# Patient Record
Sex: Female | Born: 1980 | Race: White | Hispanic: No | Smoking: Current every day smoker
Health system: Southern US, Community
[De-identification: ages and names within clinical notes are randomized; demographics above are authoritative.]

## PROBLEM LIST (undated history)

## (undated) DIAGNOSIS — S161XXA Strain of muscle, fascia and tendon at neck level, initial encounter: Secondary | ICD-10-CM

## (undated) DIAGNOSIS — F329 Major depressive disorder, single episode, unspecified: Secondary | ICD-10-CM

## (undated) DIAGNOSIS — I10 Essential (primary) hypertension: Secondary | ICD-10-CM

## (undated) DIAGNOSIS — F419 Anxiety disorder, unspecified: Secondary | ICD-10-CM

## (undated) DIAGNOSIS — F32A Depression, unspecified: Secondary | ICD-10-CM

## (undated) DIAGNOSIS — O139 Gestational [pregnancy-induced] hypertension without significant proteinuria, unspecified trimester: Secondary | ICD-10-CM

## (undated) HISTORY — DX: Depression, unspecified: F32.A

## (undated) HISTORY — DX: Anxiety disorder, unspecified: F41.9

## (undated) HISTORY — DX: Gestational (pregnancy-induced) hypertension without significant proteinuria, unspecified trimester: O13.9

## (undated) HISTORY — DX: Major depressive disorder, single episode, unspecified: F32.9

## (undated) HISTORY — DX: Strain of muscle, fascia and tendon at neck level, initial encounter: S16.1XXA

## (undated) HISTORY — DX: Essential (primary) hypertension: I10

---

## 2009-05-24 ENCOUNTER — Emergency Department (HOSPITAL_COMMUNITY): Admission: EM | Admit: 2009-05-24 | Discharge: 2009-05-24 | Payer: Self-pay | Admitting: Emergency Medicine

## 2010-12-16 ENCOUNTER — Emergency Department (HOSPITAL_COMMUNITY)
Admission: EM | Admit: 2010-12-16 | Discharge: 2010-12-16 | Disposition: A | Discharge status: Home / Self Care | Payer: PRIVATE HEALTH INSURANCE | Attending: Emergency Medicine | Admitting: Emergency Medicine

## 2010-12-16 DIAGNOSIS — R300 Dysuria: Secondary | ICD-10-CM | POA: Insufficient documentation

## 2010-12-16 DIAGNOSIS — Z87442 Personal history of urinary calculi: Secondary | ICD-10-CM | POA: Insufficient documentation

## 2010-12-16 DIAGNOSIS — R10814 Left lower quadrant abdominal tenderness: Secondary | ICD-10-CM | POA: Insufficient documentation

## 2010-12-16 LAB — URINALYSIS, ROUTINE W REFLEX MICROSCOPIC
Hgb urine dipstick: NEGATIVE
Nitrite: NEGATIVE
Specific Gravity, Urine: 1.015 (ref 1.005–1.030)
Urobilinogen, UA: 1 mg/dL (ref 0.0–1.0)
pH: 7.5 (ref 5.0–8.0)

## 2010-12-16 LAB — POCT PREGNANCY, URINE: Preg Test, Ur: NEGATIVE

## 2011-01-16 LAB — PREGNANCY, URINE: Preg Test, Ur: NEGATIVE

## 2011-01-16 LAB — URINALYSIS, ROUTINE W REFLEX MICROSCOPIC
Ketones, ur: NEGATIVE mg/dL
Leukocytes, UA: NEGATIVE
Nitrite: NEGATIVE
Protein, ur: NEGATIVE mg/dL

## 2011-01-16 LAB — URINE CULTURE: Culture: NO GROWTH

## 2011-01-16 LAB — URINE MICROSCOPIC-ADD ON

## 2014-01-30 ENCOUNTER — Other Ambulatory Visit: Payer: Self-pay

## 2014-01-30 ENCOUNTER — Encounter (HOSPITAL_COMMUNITY): Payer: Self-pay | Admitting: Emergency Medicine

## 2014-01-30 ENCOUNTER — Emergency Department (HOSPITAL_COMMUNITY)
Admission: EM | Admit: 2014-01-30 | Discharge: 2014-01-30 | Disposition: A | Discharge status: Home / Self Care | Payer: BC Managed Care – PPO | Attending: Emergency Medicine | Admitting: Emergency Medicine

## 2014-01-30 ENCOUNTER — Emergency Department (HOSPITAL_COMMUNITY): Payer: BC Managed Care – PPO

## 2014-01-30 DIAGNOSIS — M940 Chondrocostal junction syndrome [Tietze]: Secondary | ICD-10-CM | POA: Insufficient documentation

## 2014-01-30 DIAGNOSIS — R0602 Shortness of breath: Secondary | ICD-10-CM | POA: Diagnosis present

## 2014-01-30 LAB — TROPONIN I: Troponin I: <0.3 ng/mL (ref <0.30)

## 2014-01-30 LAB — POC URINE PREG, ED: Preg Test, Ur: NEGATIVE

## 2014-01-30 LAB — D-DIMER, QUANTITATIVE: D-Dimer, Quant: <0.27 ug/mL-FEU (ref 0.00–0.48)

## 2014-01-30 MED ORDER — IBUPROFEN 800 MG PO TABS
800.0000 mg | ORAL_TABLET | Freq: Once | ORAL | Status: AC
  Administered 2014-01-30: 800 mg via ORAL
  Filled 2014-01-30: qty 1

## 2014-01-30 MED ORDER — ACETAMINOPHEN 325 MG PO TABS
650.0000 mg | ORAL_TABLET | Freq: Once | ORAL | Status: AC
  Administered 2014-01-30: 650 mg via ORAL

## 2014-01-30 MED ORDER — ACETAMINOPHEN 325 MG PO TABS
ORAL_TABLET | ORAL | Status: AC
  Filled 2014-01-30: qty 2

## 2014-01-30 NOTE — ED Notes (Signed)
Patient given discharge instruction, verbalized understand. Patient ambulatory out of the department.  

## 2014-01-30 NOTE — ED Provider Notes (Signed)
CSN: [id number]     Arrival date & time 01/30/14  1500 History   First MD Initiated Contact with Patient 01/30/14 1717     Chief Complaint  Patient presents with  . Shortness of Breath  . Chest Pain     (Consider location/radiation/quality/duration/timing/severity/associated sxs/prior Treatment) HPI Comments: And patient is a 33 year old female presents to the emergency department with complaint of chest pain and shortness of breath for approximately 2 weeks. The patient states that she has a pain in the Center of her anterior chest that is sharp and stabbing at times. The patient states that this pain can last for several hours and some time up to a half a day. It is aggravated when she lifts, particularly when she lifts her 32 pounds baby boy. She's not had coughing, wheezing, hemoptysis, or high fever. She's not had any direct injury or trauma to the chest area. She has no history of cardiopulmonary disease. She has tried Tylenol for this discomfort, and it has been unsuccessful in helping her discomfort.  Patient is a 33 y.o. female presenting with shortness of breath and chest pain. The history is provided by the patient.  Shortness of Breath Associated symptoms: chest pain   Associated symptoms: no abdominal pain, no cough, no neck pain and no wheezing   Chest Pain Associated symptoms: shortness of breath   Associated symptoms: no abdominal pain, no back pain, no cough, no dizziness and no palpitations     History reviewed. No pertinent past medical history. History reviewed. No pertinent past surgical history. History reviewed. No pertinent family history. History  Substance Use Topics  . Smoking status: Never Smoker   . Smokeless tobacco: Not on file  . Alcohol Use: No   OB History   Grav Para Term Preterm Abortions TAB SAB Ect Mult Living                 Review of Systems  Constitutional: Negative for activity change.       All ROS Neg except as noted in HPI  HENT:  Negative for nosebleeds.   Eyes: Negative for photophobia and discharge.  Respiratory: Positive for shortness of breath. Negative for cough and wheezing.   Cardiovascular: Positive for chest pain. Negative for palpitations.  Gastrointestinal: Negative for abdominal pain and blood in stool.  Genitourinary: Negative for dysuria, frequency and hematuria.  Musculoskeletal: Negative for arthralgias, back pain and neck pain.  Skin: Negative.   Neurological: Negative for dizziness, seizures and speech difficulty.  Psychiatric/Behavioral: Negative for hallucinations and confusion.      Allergies  Peanut-containing drug products  Home Medications   Prior to Admission medications   Medication Sig Start Date End Date Taking? Authorizing Provider  acetaminophen (TYLENOL) 500 MG tablet Take 500 mg by mouth every 6 (six) hours as needed for mild pain or moderate pain.   Yes Historical Provider, MD   BP 158/103  Pulse 116  Temp(Src) 98.1 F (36.7 C) (Oral)  Resp 20  Ht 5\' 5"  (1.651 m)  Wt 155 lb (70.308 kg)  BMI 25.79 kg/m2  SpO2 100%  LMP 01/19/2014 Physical Exam  Nursing note and vitals reviewed. Constitutional: She is oriented to person, place, and time. She appears well-developed and well-nourished.  Non-toxic appearance.  HENT:  Head: Normocephalic.  Right Ear: Tympanic membrane and external ear normal.  Left Ear: Tympanic membrane and external ear normal.  Eyes: EOM and lids are normal. Pupils are equal, round, and reactive to light.  Neck: Normal  range of motion. Neck supple. Carotid bruit is not present.  Cardiovascular: Normal rate, regular rhythm, normal heart sounds, intact distal pulses and normal pulses.  Exam reveals no gallop and no friction rub.   No murmur heard. No JVD.  Pulmonary/Chest: Breath sounds normal. No respiratory distress. She exhibits tenderness.  Lungs are clear. Patient speaks in complete sentences without problem. There is symmetrical rise and fall of  the chest.  I can't reproduce the pain by palpation of the mid sternum. And also a the left chest adjacent to the sternum.  Abdominal: Soft. Bowel sounds are normal. There is no tenderness. There is no guarding.  Musculoskeletal: Normal range of motion. She exhibits no edema.  Lymphadenopathy:       Head (right side): No submandibular adenopathy present.       Head (left side): No submandibular adenopathy present.    She has no cervical adenopathy.  Neurological: She is alert and oriented to person, place, and time. She has normal strength. No cranial nerve deficit or sensory deficit.  Skin: Skin is warm and dry.  Psychiatric: She has a normal mood and affect. Her speech is normal.    ED Course  Procedures (including critical care time) Labs Review Labs Reviewed  TROPONIN I  POC URINE PREG, ED    Imaging Review No results found.   EKG Interpretation None      MDM The chest xray is negative for acute problem. D dimer and troponin negative for acute event. The EKG reveals NSR, no acute event. Pt treated with ibuprofen and tylenol. Pt to return if any changes or problem.   Final diagnoses:  None    *I have reviewed nursing notes, vital signs, and all appropriate lab and imaging results for this patient.Kathie Dike**    Roylee Chaffin M Richrd Kuzniar, PA-C 02/01/14 [phone]

## 2014-01-30 NOTE — Discharge Instructions (Signed)
Your electrocardiogram shows a normal sinus rhythm, without acute event. Your creatinine sign is negative for acute problem. Your test for blood clot is negative, and your chest x-ray is also negative. Your examination is consistent with costochondritis, or inflammation in the cartilage beside your sternum. Please use ibuprofen 800 mg every 6 hours as Costochondritis Costochondritis, sometimes called Tietze syndrome, is a swelling and irritation (inflammation) of the tissue (cartilage) that connects your ribs with your breastbone (sternum). It causes pain in the chest and rib area. Costochondritis usually goes away on its own over time. It can take up to 6 weeks or longer to get better, especially if you are unable to limit your activities. CAUSES  Some cases of costochondritis have no known cause. Possible causes include:  Injury (trauma).  Exercise or activity such as lifting.  Severe coughing. SIGNS AND SYMPTOMS  Pain and tenderness in the chest and rib area.  Pain that gets worse when coughing or taking deep breaths.  Pain that gets worse with specific movements. DIAGNOSIS  Your health care provider will do a physical exam and ask about your symptoms. Chest X-rays or other tests may be done to rule out other problems. TREATMENT  Costochondritis usually goes away on its own over time. Your health care provider may prescribe medicine to help relieve pain. HOME CARE INSTRUCTIONS   Avoid exhausting physical activity. Try not to strain your ribs during normal activity. This would include any activities using chest, abdominal, and side muscles, especially if heavy weights are used.  Apply ice to the affected area for the first 2 days after the pain begins.  Put ice in a plastic bag.  Place a towel between your skin and the bag.  Leave the ice on for 20 minutes, 2 3 times a day.  Only take over-the-counter or prescription medicines as directed by your health care provider. SEEK  MEDICAL CARE IF:  You have redness or swelling at the rib joints. These are signs of infection.  Your pain does not go away despite rest or medicine. SEEK IMMEDIATE MEDICAL CARE IF:   Your pain increases or you are very uncomfortable.  You have shortness of breath or difficulty breathing.  You cough up blood.  You have worse chest pains, sweating, or vomiting.  You have a fever or persistent symptoms for more than 2 3 days.  You have a fever and your symptoms suddenly get worse. MAKE SURE YOU:   Understand these instructions.  Will watch your condition.  Will get help right away if you are not doing well or get worse. Document Released: 07/06/2005 Document Revised: 07/17/2013 Document Reviewed: 04/30/2013 [facility] Patient Information 2014 [place], [place]. , may use Tylenol Extra Strength in between the 6 hour doses of ibuprofen.

## 2014-01-30 NOTE — ED Notes (Signed)
Sob times 2 weeks.  Chest pain started last night.

## 2014-02-01 NOTE — ED Provider Notes (Signed)
Medical screening examination/treatment/procedure(s) were performed by non-physician practitioner and as supervising physician I was immediately available for consultation/collaboration.   EKG Interpretation None        Iliani Vejar L Dacen Frayre, MD 02/01/14 1538 

## 2016-02-04 ENCOUNTER — Encounter (HOSPITAL_COMMUNITY): Payer: Self-pay | Admitting: Emergency Medicine

## 2016-02-04 ENCOUNTER — Emergency Department (HOSPITAL_COMMUNITY)
Admission: EM | Admit: 2016-02-04 | Discharge: 2016-02-04 | Disposition: A | Discharge status: Home / Self Care | Payer: Medicaid Other | Attending: Emergency Medicine | Admitting: Emergency Medicine

## 2016-02-04 DIAGNOSIS — H66002 Acute suppurative otitis media without spontaneous rupture of ear drum, left ear: Secondary | ICD-10-CM

## 2016-02-04 DIAGNOSIS — H9202 Otalgia, left ear: Secondary | ICD-10-CM | POA: Diagnosis present

## 2016-02-04 DIAGNOSIS — H66005 Acute suppurative otitis media without spontaneous rupture of ear drum, recurrent, left ear: Secondary | ICD-10-CM | POA: Insufficient documentation

## 2016-02-04 DIAGNOSIS — Z79899 Other long term (current) drug therapy: Secondary | ICD-10-CM | POA: Diagnosis not present

## 2016-02-04 DIAGNOSIS — Z791 Long term (current) use of non-steroidal anti-inflammatories (NSAID): Secondary | ICD-10-CM | POA: Insufficient documentation

## 2016-02-04 DIAGNOSIS — I10 Essential (primary) hypertension: Secondary | ICD-10-CM | POA: Diagnosis not present

## 2016-02-04 DIAGNOSIS — J029 Acute pharyngitis, unspecified: Secondary | ICD-10-CM | POA: Insufficient documentation

## 2016-02-04 LAB — BASIC METABOLIC PANEL
Anion gap: 10 (ref 5–15)
BUN: 8 mg/dL (ref 6–20)
CALCIUM: 9.4 mg/dL (ref 8.9–10.3)
CO2: 26 mmol/L (ref 22–32)
CREATININE: 0.78 mg/dL (ref 0.44–1.00)
Chloride: 104 mmol/L (ref 101–111)
GFR calc Af Amer: >60 mL/min (ref >60)
Glucose, Bld: 96 mg/dL (ref 65–99)
Potassium: 3.5 mmol/L (ref 3.5–5.1)
SODIUM: 140 mmol/L (ref 135–145)

## 2016-02-04 MED ORDER — AMOXICILLIN 250 MG PO CAPS
500.0000 mg | ORAL_CAPSULE | Freq: Once | ORAL | Status: AC
  Administered 2016-02-04: 500 mg via ORAL
  Filled 2016-02-04: qty 2

## 2016-02-04 MED ORDER — AMLODIPINE BESYLATE 5 MG PO TABS: 5.0000 mg | ORAL_TABLET | Freq: Every day | ORAL | Status: DC

## 2016-02-04 MED ORDER — AMOXICILLIN 500 MG PO CAPS: 500.0000 mg | ORAL_CAPSULE | Freq: Three times a day (TID) | ORAL | Status: AC

## 2016-02-04 NOTE — ED Notes (Signed)
Pt states she is having bilateral ear pain worse on the left.

## 2016-02-04 NOTE — Discharge Instructions (Signed)
DASH Eating Plan °DASH stands for "Dietary Approaches to Stop Hypertension." The DASH eating plan is a healthy eating plan that has been shown to reduce high blood pressure (hypertension). Additional health benefits may include reducing the risk of type 2 diabetes mellitus, heart disease, and stroke. The DASH eating plan may also help with weight loss. °WHAT DO I NEED TO KNOW ABOUT THE DASH EATING PLAN? °For the DASH eating plan, you will follow these general guidelines: °· Choose foods with a percent daily value for sodium of less than 5% (as listed on the food label). °· Use salt-free seasonings or herbs instead of table salt or sea salt. °· Check with your health care provider or pharmacist before using salt substitutes. °· Eat lower-sodium products, often labeled as "lower sodium" or "no salt added." °· Eat fresh foods. °· Eat more vegetables, fruits, and low-fat dairy products. °· Choose whole grains. Look for the word "whole" as the first word in the ingredient list. °· Choose fish and skinless chicken or turkey more often than red meat. Limit fish, poultry, and meat to 6 oz (170 g) each day. °· Limit sweets, desserts, sugars, and sugary drinks. °· Choose heart-healthy fats. °· Limit cheese to 1 oz (28 g) per day. °· Eat more home-cooked food and less restaurant, buffet, and fast food. °· Limit fried foods. °· Cook foods using methods other than frying. °· Limit canned vegetables. If you do use them, rinse them well to decrease the sodium. °· When eating at a restaurant, ask that your food be prepared with less salt, or no salt if possible. °WHAT FOODS CAN I EAT? °Seek help from a dietitian for individual calorie needs. °Grains °Whole grain or whole wheat bread. Brown rice. Whole grain or whole wheat pasta. Quinoa, bulgur, and whole grain cereals. Low-sodium cereals. Corn or whole wheat flour tortillas. Whole grain cornbread. Whole grain crackers. Low-sodium crackers. °Vegetables °Fresh or frozen vegetables  (raw, steamed, roasted, or grilled). Low-sodium or reduced-sodium tomato and vegetable juices. Low-sodium or reduced-sodium tomato sauce and paste. Low-sodium or reduced-sodium canned vegetables.  °Fruits °All fresh, canned (in natural juice), or frozen fruits. °Meat and Other Protein Products °Ground beef (85% or leaner), grass-fed beef, or beef trimmed of fat. Skinless chicken or turkey. Ground chicken or turkey. Pork trimmed of fat. All fish and seafood. Eggs. Dried beans, peas, or lentils. Unsalted nuts and seeds. Unsalted canned beans. °Dairy °Low-fat dairy products, such as skim or 1% milk, 2% or reduced-fat cheeses, low-fat ricotta or cottage cheese, or plain low-fat yogurt. Low-sodium or reduced-sodium cheeses. °Fats and Oils °Tub margarines without trans fats. Light or reduced-fat mayonnaise and salad dressings (reduced sodium). Avocado. Safflower, olive, or canola oils. Natural peanut or almond butter. °Other °Unsalted popcorn and pretzels. °The items listed above may not be a complete list of recommended foods or beverages. Contact your dietitian for more options. °WHAT FOODS ARE NOT RECOMMENDED? °Grains °White bread. White pasta. White rice. Refined cornbread. Bagels and croissants. Crackers that contain trans fat. °Vegetables °Creamed or fried vegetables. Vegetables in a cheese sauce. Regular canned vegetables. Regular canned tomato sauce and paste. Regular tomato and vegetable juices. °Fruits °Dried fruits. Canned fruit in light or heavy syrup. Fruit juice. °Meat and Other Protein Products °Fatty cuts of meat. Ribs, chicken wings, bacon, sausage, bologna, salami, chitterlings, fatback, hot dogs, bratwurst, and packaged luncheon meats. Salted nuts and seeds. Canned beans with salt. °Dairy °Whole or 2% milk, cream, half-and-half, and cream cheese. Whole-fat or sweetened yogurt. Full-fat   cheeses or blue cheese. Nondairy creamers and whipped toppings. Processed cheese, cheese spreads, or cheese  curds. Condiments Onion and garlic salt, seasoned salt, table salt, and sea salt. Canned and packaged gravies. Worcestershire sauce. Tartar sauce. Barbecue sauce. Teriyaki sauce. Soy sauce, including reduced sodium. Steak sauce. Fish sauce. Oyster sauce. Cocktail sauce. Horseradish. Ketchup and mustard. Meat flavorings and tenderizers. Bouillon cubes. Hot sauce. Tabasco sauce. Marinades. Taco seasonings. Relishes. Fats and Oils Butter, stick margarine, lard, shortening, ghee, and bacon fat. Coconut, palm kernel, or palm oils. Regular salad dressings. Other Pickles and olives. Salted popcorn and pretzels. The items listed above may not be a complete list of foods and beverages to avoid. Contact your dietitian for more information. WHERE CAN I FIND MORE INFORMATION? National Heart, Lung, and Blood Institute: [employer]   This information is not intended to replace advice given to you by your health care provider. Make sure you discuss any questions you have with your health care provider.   Document Released: 09/15/2011 Document Revised: 10/17/2014 Document Reviewed: 07/31/2013 Elsevier Interactive Patient Education 2016 [employer].  Otitis Media, Adult Otitis media is redness, soreness, and inflammation of the middle ear. Otitis media may be caused by allergies or, most commonly, by infection. Often it occurs as a complication of the common cold. SIGNS AND SYMPTOMS Symptoms of otitis media may include:  Earache.  Fever.  Ringing in your ear.  Headache.  Leakage of fluid from the ear. DIAGNOSIS To diagnose otitis media, your health care provider will examine your ear with an otoscope. This is an instrument that allows your health care provider to see into your ear in order to examine your eardrum. Your health care provider also will ask you questions about your symptoms. TREATMENT  Typically, otitis media resolves on its own within 3-5 days.  Your health care provider may prescribe medicine to ease your symptoms of pain. If otitis media does not resolve within 5 days or is recurrent, your health care provider may prescribe antibiotic medicines if he or she suspects that a bacterial infection is the cause. HOME CARE INSTRUCTIONS   If you were prescribed an antibiotic medicine, finish it all even if you start to feel better.  Take medicines only as directed by your health care provider.  Keep all follow-up visits as directed by your health care provider. SEEK MEDICAL CARE IF:  You have otitis media only in one ear, or bleeding from your nose, or both.  You notice a lump on your neck.  You are not getting better in 3-5 days.  You feel worse instead of better. SEEK IMMEDIATE MEDICAL CARE IF:   You have pain that is not controlled with medicine.  You have swelling, redness, or pain around your ear or stiffness in your neck.  You notice that part of your face is paralyzed.  You notice that the bone behind your ear (mastoid) is tender when you touch it. MAKE SURE YOU:   Understand these instructions.  Will watch your condition.  Will get help right away if you are not doing well or get worse.   This information is not intended to replace advice given to you by your health care provider. Make sure you discuss any questions you have with your health care provider.   Document Released: 07/01/2004 Document Revised: 10/17/2014 Document Reviewed: 04/23/2013 Elsevier Interactive Patient Education 2016 [employer].   Avoid taking sudafed or products which contain this ingredient (pseudoephedrine).  You can safely use coricidin brand without elevating  your blood pressure.  Take the entire course of antibiotics.

## 2016-02-04 NOTE — ED Provider Notes (Signed)
CSN: [id number]     Arrival date & time 02/04/16  4098 History   First MD Initiated Contact with Patient 02/04/16 [phone]     Chief Complaint  Patient presents with  . Otalgia     (Consider location/radiation/quality/duration/timing/severity/associated sxs/prior Treatment) The history is provided by the patient.   Erika Cain is a 35 y.o. female in for evaluation of bilateral ear pain left greater than right since she developed nasal and sinus congestion over the past several days.  She has had subjective fevers and endorses.  Went nasal discharge along with mild sore throat.  There is been no drainage from her ears.  She endorses decreased hearing acuity on the left with occasional popping sensation.  She has taken Sudafed and ibuprofen without significant relief.  She denies cough, chest pain, shortness of breath.  Additionally, notes that her blood pressure is very elevated today, describes having a strong family history of blood pressure issues.  She denies chest pain, vision changes, but does endorse almost daily mild nagging generalized headaches which typically improved with ibuprofen.  She denies focal weakness, dizziness.   History reviewed. No pertinent past medical history. History reviewed. No pertinent past surgical history. History reviewed. No pertinent family history. Social History  Substance Use Topics  . Smoking status: Never Smoker   . Smokeless tobacco: None  . Alcohol Use: No   OB History    No data available     Review of Systems  Constitutional: Positive for fever.  HENT: Positive for congestion, ear pain, rhinorrhea, sinus pressure and sore throat. Negative for ear discharge.   Eyes: Negative.   Respiratory: Negative for chest tightness and shortness of breath.   Cardiovascular: Negative for chest pain.  Gastrointestinal: Negative for nausea and abdominal pain.  Genitourinary: Negative.   Musculoskeletal: Negative for joint swelling, arthralgias and  neck pain.  Skin: Negative.  Negative for rash and wound.  Neurological: Positive for headaches. Negative for dizziness, weakness, light-headedness and numbness.  Psychiatric/Behavioral: Negative.       Allergies  Peanut-containing drug products and Codeine  Home Medications   Prior to Admission medications   Medication Sig Start Date End Date Taking? Authorizing Provider  acetaminophen (TYLENOL) 500 MG tablet Take 500 mg by mouth every 6 (six) hours as needed for mild pain or moderate pain.   Yes Historical Provider, MD  ibuprofen (ADVIL,MOTRIN) 200 MG tablet Take 400 mg by mouth every 6 (six) hours as needed.   Yes Historical Provider, MD  amLODipine (NORVASC) 5 MG tablet Take 1 tablet (5 mg total) by mouth daily. 02/04/16   Burgess Amor, PA-C  amoxicillin (AMOXIL) 500 MG capsule Take 1 capsule (500 mg total) by mouth 3 (three) times daily. 02/04/16 02/14/16  Burgess Amor, PA-C   BP 175/102 mmHg  Pulse 87  Temp(Src) 98.2 F (36.8 C)  Resp 18  Ht  (1.651 m)  Wt 72.576 kg  BMI 26.63 kg/m2  SpO2 99%  LMP 02/02/2016 Physical Exam  Constitutional: She is oriented to person, place, and time. She appears well-developed and well-nourished.  HENT:  Head: Normocephalic and atraumatic.  Right Ear: Tympanic membrane and ear canal normal.  Left Ear: Ear canal normal. Tympanic membrane is erythematous and bulging.  Nose: Mucosal edema and rhinorrhea present.  Mouth/Throat: Uvula is midline, oropharynx is clear and moist and mucous membranes are normal. No oropharyngeal exudate, posterior oropharyngeal edema, posterior oropharyngeal erythema or tonsillar abscesses.  Eyes: Conjunctivae are normal.  Cardiovascular: Normal rate and normal  heart sounds.   Pulmonary/Chest: Effort normal. No respiratory distress. She has no wheezes. She has no rales.  Abdominal: Soft. There is no tenderness.  Musculoskeletal: Normal range of motion.  Neurological: She is alert and oriented to person, place, and  time.  Skin: Skin is warm and dry. No rash noted.  Psychiatric: She has a normal mood and affect.    ED Course  Procedures (including critical care time) Labs Review Labs Reviewed  BASIC METABOLIC PANEL   Results for orders placed or performed during the hospital encounter of 02/04/16  Basic metabolic panel  Result Value Ref Range   Sodium 140 135 - 145 mmol/L   Potassium 3.5 3.5 - 5.1 mmol/L   Chloride 104 101 - 111 mmol/L   CO2 26 22 - 32 mmol/L   Glucose, Bld 96 65 - 99 mg/dL   BUN 8 6 - 20 mg/dL   Creatinine, Ser 1.610.78 0.44 - 1.00 mg/dL   Calcium 9.4 8.9 - 09.610.3 mg/dL   GFR calc non Af Amer >60 >60 mL/min   GFR calc Af Amer >60 >60 mL/min   Anion gap 10 5 - 15   No results found.   Imaging Review No results found. I have personally reviewed and evaluated these images and lab results as part of my medical decision-making.   EKG Interpretation None      MDM   Final diagnoses:  Acute suppurative otitis media of left ear without spontaneous rupture of tympanic membrane, recurrence not specified  Essential hypertension    Patient was prescribed amoxicillin for her left otitis media.  Advised to avoid Sudafed, recommended courses and brand to help minimize increasing her blood pressure.  She was placed on a low dose Norvasc, asked to check her blood pressures once or twice a week and plan follow-up care.  She is given referral to try to do pediatric medicine to establish primary care.  She was given information regarding hypertension and removing salt from her diet.  She is stable for discharge home.    Burgess AmorJulie Sashia Campas, PA-C 02/04/16 1018  Donnetta HutchingBrian Cook, MD 02/05/16 [phone]

## 2017-01-27 ENCOUNTER — Encounter (HOSPITAL_COMMUNITY): Payer: Self-pay | Admitting: *Deleted

## 2017-01-27 ENCOUNTER — Emergency Department (HOSPITAL_COMMUNITY)
Admission: EM | Admit: 2017-01-27 | Discharge: 2017-01-27 | Disposition: A | Discharge status: Home / Self Care | Payer: 59 | Attending: Emergency Medicine | Admitting: Emergency Medicine

## 2017-01-27 DIAGNOSIS — J0100 Acute maxillary sinusitis, unspecified: Secondary | ICD-10-CM | POA: Insufficient documentation

## 2017-01-27 DIAGNOSIS — Z79899 Other long term (current) drug therapy: Secondary | ICD-10-CM | POA: Diagnosis not present

## 2017-01-27 DIAGNOSIS — I10 Essential (primary) hypertension: Secondary | ICD-10-CM | POA: Diagnosis not present

## 2017-01-27 DIAGNOSIS — N390 Urinary tract infection, site not specified: Secondary | ICD-10-CM

## 2017-01-27 DIAGNOSIS — R300 Dysuria: Secondary | ICD-10-CM | POA: Diagnosis present

## 2017-01-27 LAB — I-STAT CHEM 8, ED
BUN: 7 mg/dL (ref 6–20)
CHLORIDE: 101 mmol/L (ref 101–111)
CREATININE: 0.7 mg/dL (ref 0.44–1.00)
Calcium, Ion: 1.21 mmol/L (ref 1.15–1.40)
GLUCOSE: 92 mg/dL (ref 65–99)
HEMATOCRIT: 43 % (ref 36.0–46.0)
Hemoglobin: 14.6 g/dL (ref 12.0–15.0)
POTASSIUM: 3.3 mmol/L — AB (ref 3.5–5.1)
Sodium: 140 mmol/L (ref 135–145)
TCO2: 27 mmol/L (ref 0–100)

## 2017-01-27 LAB — URINALYSIS, ROUTINE W REFLEX MICROSCOPIC
BILIRUBIN URINE: NEGATIVE
GLUCOSE, UA: NEGATIVE mg/dL
HGB URINE DIPSTICK: NEGATIVE
KETONES UR: NEGATIVE mg/dL
NITRITE: POSITIVE — AB
PH: 6 (ref 5.0–8.0)
Protein, ur: NEGATIVE mg/dL
SPECIFIC GRAVITY, URINE: 1.01 (ref 1.005–1.030)

## 2017-01-27 LAB — PREGNANCY, URINE: Preg Test, Ur: NEGATIVE

## 2017-01-27 LAB — I-STAT CREATININE, ED: CREATININE: 0.8 mg/dL (ref 0.44–1.00)

## 2017-01-27 MED ORDER — CEPHALEXIN 500 MG PO CAPS: 500.0000 mg | ORAL_CAPSULE | Freq: Four times a day (QID) | ORAL | 0 refills | Status: DC

## 2017-01-27 MED ORDER — CEPHALEXIN 500 MG PO CAPS
500.0000 mg | ORAL_CAPSULE | Freq: Once | ORAL | Status: AC
  Administered 2017-01-27: 500 mg via ORAL
  Filled 2017-01-27: qty 1

## 2017-01-27 MED ORDER — POTASSIUM CHLORIDE CRYS ER 20 MEQ PO TBCR: 20.0000 meq | EXTENDED_RELEASE_TABLET | Freq: Every day | ORAL | 0 refills | Status: DC

## 2017-01-27 MED ORDER — HYDROCHLOROTHIAZIDE 25 MG PO TABS: 25.0000 mg | ORAL_TABLET | Freq: Every day | ORAL | 0 refills | Status: DC

## 2017-01-27 MED ORDER — HYDROCHLOROTHIAZIDE 25 MG PO TABS
25.0000 mg | ORAL_TABLET | Freq: Every day | ORAL | Status: DC
  Administered 2017-01-27: 25 mg via ORAL
  Filled 2017-01-27: qty 1

## 2017-01-27 NOTE — ED Triage Notes (Signed)
Pt c/o lower back pain and pain with urination for the last few days; pt also c/o sinus pressure and states she has been taking sudafed

## 2017-01-27 NOTE — Discharge Instructions (Addendum)
Drink plenty of water.  Take the antibiotic as directed until it's finished. You may also try taking over-the-counter Flonase nasal spray or Claritin for 1-2 weeks as directed. Follow up with her primary doctor or return here for any worsening symptoms such as vomiting, back pain, fever and chills

## 2017-01-27 NOTE — ED Provider Notes (Signed)
[facility]-EMERGENCY DEPT Provider Note   CSN: [id number] Arrival date & time: 01/27/17  0908     History   Chief Complaint Chief Complaint  Patient presents with  . Dysuria    HPI Erika Cain is a 36 y.o. female.  HPI   Erika Cain is a 36 y.o. female who presents to the Emergency Department complaining of Lower abdominal pressure and low back pain with urination. Symptoms have been present for 3-4 days. She also complains of urinary frequency and hesitancy. She states that she has had previous urinary tract infections and her symptoms today feel similar. She reports urine is malodorous and appears cloudy. She also describes sinus pressure and nasal congestion for nearly one week. She has been taking over-the-counter Sudafed daily  History reviewed. No pertinent past medical history.  There are no active problems to display for this patient.   History reviewed. No pertinent surgical history.  OB History    No data available       Home Medications    Prior to Admission medications   Medication Sig Start Date End Date Taking? Authorizing Provider  acetaminophen (TYLENOL) 500 MG tablet Take 1,000 mg by mouth every 6 (six) hours as needed.   Yes Historical Provider, MD  cephALEXin (KEFLEX) 500 MG capsule Take 1 capsule (500 mg total) by mouth 4 (four) times daily. 01/27/17   Shakeya Kerkman, PA-C    Family History History reviewed. No pertinent family history.  Social History Social History  Substance Use Topics  . Smoking status: Never Smoker  . Smokeless tobacco: Never Used  . Alcohol use No     Allergies   Peanut-containing drug products and Codeine   Review of Systems Review of Systems  Constitutional: Negative for activity change, appetite change, chills and fever.  HENT: Positive for congestion, sinus pain and sinus pressure.   Respiratory: Negative for chest tightness and shortness of breath.   Cardiovascular: Negative for chest pain.    Gastrointestinal: Positive for abdominal pain. Negative for nausea and vomiting.  Genitourinary: Positive for dysuria, frequency and urgency. Negative for decreased urine volume, difficulty urinating, flank pain, hematuria, vaginal bleeding and vaginal discharge.  Musculoskeletal: Positive for back pain.  Skin: Negative for rash.  Neurological: Negative for dizziness, weakness and numbness.  Hematological: Negative for adenopathy.  Psychiatric/Behavioral: Negative for confusion.  All other systems reviewed and are negative.    Physical Exam Updated Vital Signs BP (!) 182/131 (BP Location: Right Arm)   Pulse (!) 104   Temp 98.6 F (37 C) (Oral)   Resp 16   Ht  (1.651 m)   Wt 70.3 kg   LMP 01/18/2017   SpO2 100%   BMI 25.79 kg/m   Physical Exam  Constitutional: She is oriented to person, place, and time. She appears well-developed and well-nourished. No distress.  HENT:  Head: Normocephalic and atraumatic.  Right Ear: Tympanic membrane and ear canal normal.  Left Ear: Tympanic membrane and ear canal normal.  Nose: Mucosal edema present. Right sinus exhibits maxillary sinus tenderness. Left sinus exhibits maxillary sinus tenderness.  Mouth/Throat: Uvula is midline, oropharynx is clear and moist and mucous membranes are normal.  Neck: Normal range of motion. Neck supple.  Cardiovascular: Normal rate, regular rhythm, normal heart sounds and intact distal pulses.   No murmur heard. Pulmonary/Chest: Effort normal and breath sounds normal. No respiratory distress. She has no wheezes. She has no rales.  Abdominal: Soft. Normal appearance. She exhibits no distension and no mass. There  is no hepatosplenomegaly. There is tenderness in the suprapubic area. There is no rigidity, no rebound, no guarding, no CVA tenderness and no tenderness at McBurney's point.  Mild ttp of the suprapubic region.  Remaining abdomen is soft, non-tender without guarding or rebound tenderness. No CVA  tenderness  Musculoskeletal: Normal range of motion. She exhibits no edema.  Lymphadenopathy:    She has no cervical adenopathy.  Neurological: She is alert and oriented to person, place, and time. Coordination normal.  Skin: Skin is warm and dry. No rash noted.  Psychiatric: She has a normal mood and affect.  Nursing note and vitals reviewed.    ED Treatments / Results  Labs (all labs ordered are listed, but only abnormal results are displayed) Labs Reviewed  URINALYSIS, ROUTINE W REFLEX MICROSCOPIC - Abnormal; Notable for the following:       Result Value   APPearance HAZY (*)    Nitrite POSITIVE (*)    Leukocytes, UA LARGE (*)    Bacteria, UA MANY (*)    Squamous Epithelial / LPF 0-5 (*)    All other components within normal limits  URINE CULTURE  PREGNANCY, URINE    EKG  EKG Interpretation None       Radiology No results found.  Procedures Procedures (including critical care time)  Medications Ordered in ED Medications  cephALEXin (KEFLEX) capsule 500 mg (not administered)     Initial Impression / Assessment and Plan / ED Course  I have reviewed the triage vital signs and the nursing notes.  Pertinent labs & imaging results that were available during my care of the patient were reviewed by me and considered in my medical decision making (see chart for details).     Pt well appearing.  Non-toxic.  No symptoms to suggest pyelonephritis.  Pt reports taking sudafed for several days and was found to be hypertensive.  Upon recheck, BP checked manually and remains elevated although pt asymptomatic except dysuria.  Will check creatinine and likely begin a diuretic.    Pt is requesting d/c.  Asymptomatic except urinary sx's.  Rx for HCTZ and potassium. Referral info given and pt advised to establish primary care.  Return precautions discussed.  Final Clinical Impressions(s) / ED Diagnoses   Final diagnoses:  Urinary tract infection in female  Acute maxillary  sinusitis, recurrence not specified  Essential hypertension    New Prescriptions Discharge Medication List as of 01/27/2017 12:19 PM    START taking these medications   Details  cephALEXin (KEFLEX) 500 MG capsule Take 1 capsule (500 mg total) by mouth 4 (four) times daily., Starting Fri 01/27/2017, Print    hydrochlorothiazide (HYDRODIURIL) 25 MG tablet Take 1 tablet (25 mg total) by mouth daily., Starting Fri 01/27/2017, Print    potassium chloride SA (K-DUR,KLOR-CON) 20 MEQ tablet Take 1 tablet (20 mEq total) by mouth daily., Starting Fri 01/27/2017, Print         Jeni [place], PA-C 01/30/17 1226    Donnetta Hutching, MD 01/31/17 1226

## 2017-01-30 LAB — URINE CULTURE

## 2017-01-31 ENCOUNTER — Telehealth: Payer: Self-pay | Admitting: Emergency Medicine

## 2017-01-31 NOTE — Telephone Encounter (Signed)
Post ED Visit - Positive Culture Follow-up  Culture report reviewed by antimicrobial stewardship pharmacist:   Enzo Bi, Pharm.D.  Celedonio Miyamoto, Pharm.D., BCPS AQ-ID  Garvin Fila, Pharm.D., BCPS  Georgina Pillion, [address].D., BCPS  [place], [address].D., BCPS, AAHIVP  Estella Husk, Pharm.D., BCPS, AAHIVP  Lysle Pearl, PharmD, BCPS  Casilda Carls, PharmD, BCPS  Pollyann Samples, PharmD, BCPS  Positive urine culture Treated with cephalexin, organism sensitive to the same and no further patient follow-up is required at this time.  Berle Mull 01/31/2017, 11:01 AM

## 2017-02-03 ENCOUNTER — Telehealth: Payer: Self-pay | Admitting: Family Medicine

## 2017-02-03 NOTE — Telephone Encounter (Signed)
Patient went to hospital for sinus infection, found out her blood pressure was extremely high, they told her to get est. With a pcp (I made her an appt for 02/28/17) in the mean time where can she go to get her blood pressure checked weekly ? She was given hydrochlorothiazide [phone]

## 2017-02-06 NOTE — Telephone Encounter (Signed)
Left Cissy a message to call office.

## 2017-02-28 ENCOUNTER — Other Ambulatory Visit: Payer: Self-pay

## 2017-02-28 ENCOUNTER — Encounter: Payer: Self-pay | Admitting: Family Medicine

## 2017-02-28 ENCOUNTER — Ambulatory Visit (INDEPENDENT_AMBULATORY_CARE_PROVIDER_SITE_OTHER): Payer: 59 | Admitting: Family Medicine

## 2017-02-28 VITALS — BP 174/110 | HR 100 | Temp 98.6°F | Resp 16 | Ht 65.0 in | Wt 150.0 lb

## 2017-02-28 DIAGNOSIS — O139 Gestational [pregnancy-induced] hypertension without significant proteinuria, unspecified trimester: Secondary | ICD-10-CM

## 2017-02-28 DIAGNOSIS — I10 Essential (primary) hypertension: Secondary | ICD-10-CM

## 2017-02-28 HISTORY — DX: Gestational (pregnancy-induced) hypertension without significant proteinuria, unspecified trimester: O13.9

## 2017-02-28 MED ORDER — LISINOPRIL-HYDROCHLOROTHIAZIDE 20-25 MG PO TABS: 1.0000 | ORAL_TABLET | Freq: Every day | ORAL | 3 refills | Status: DC

## 2017-02-28 NOTE — Progress Notes (Signed)
Chief Complaint  Patient presents with  . Hypertension   Referred from ER BP is very high Mild headache at times but no vision disturbance or nausea or dizziness had gestational hypertension with pregnancy of [age]year old. Does not think she has been told that she has elevated BP since.  Has a 34 year old with no treatment for hypertension. Sees GYN yearly. Her last 3 ER visits here show hypertension.  BP Readings from Last 3 Encounters:  02/28/17 (!) 174/110  01/27/17 (!) 187/125  02/04/16 (!) 175/102   I suspect she has had occult hypertension for years.  Up to date with health screenings     Patient Active Problem List   Diagnosis Date Noted  . Gestational hypertension 02/28/2017  . Elevated blood pressure reading with diagnosis of hypertension 02/28/2017    Outpatient Encounter Prescriptions as of 02/28/2017  Medication Sig  . acetaminophen (TYLENOL) 500 MG tablet Take 1,000 mg by mouth every 6 (six) hours as needed.  Marland Kitchen. lisinopril-hydrochlorothiazide (PRINZIDE,ZESTORETIC) 20-25 MG tablet Take 1 tablet by mouth daily.   No facility-administered encounter medications on file as of 02/28/2017.     Past Medical History:  Diagnosis Date  . Anxiety   . Depression   . Hypertension     History reviewed. No pertinent surgical history.  Social History   Social History  . Marital status: Married    Spouse name: Barbara CowerJason  . Number of children: 2  . Years of education: 14   Occupational History  . stay at home    Social History Main Topics  . Smoking status: Former Smoker    Quit date: 2013  . Smokeless tobacco: Never Used  . Alcohol use No  . Drug use: No  . Sexual activity: No   Other Topics Concern  . Not on file   Social History Narrative   Stay at home mom   2 children   [employer]   Husband Barbara CowerJason - works for [employer]       Family History  Problem Relation Age of Onset  . Hypertension Mother   . Arthritis Mother   . Depression Mother   . Drug  abuse Mother   . Hypertension Father   . Diabetes Father   . Seizures Father        TBI  . Hyperlipidemia Father   . Heart disease Maternal Grandmother   . Early death Maternal Grandmother [id number]       heart attack  . Mental illness Maternal Grandfather        suicide  . Arthritis Maternal Grandfather   . Cancer Paternal Grandfather [id number]       pancreatic and lung    Review of Systems  Constitutional: Negative for chills, fever and weight loss.  HENT: Negative for congestion and hearing loss.   Eyes: Negative for blurred vision and pain.  Respiratory: Negative for cough and shortness of breath.   Cardiovascular: Negative for chest pain and leg swelling.  Gastrointestinal: Negative for abdominal pain, constipation, diarrhea and heartburn.  Genitourinary: Negative for dysuria and frequency.  Musculoskeletal: Negative for falls, joint pain and myalgias.  Neurological: Positive for headaches. Negative for dizziness and seizures.       Mild  Psychiatric/Behavioral: Negative for depression. The patient is not nervous/anxious and does not have insomnia.     BP (!) 174/110 (BP Location: Right Arm, Patient Position: Sitting, Cuff Size: Normal)   Pulse 100   Temp 98.6 F (37 C) (Temporal)  Resp 16   Ht 5\' 5"  (1.651 m)   Wt 150 lb 0.6 oz (68.1 kg)   LMP 02/01/2017 (Exact Date)   SpO2 97%   BMI 24.97 kg/m   Physical Exam  Constitutional: She is oriented to person, place, and time. She appears well-developed and well-nourished.  HENT:  Head: Normocephalic and atraumatic.  Right Ear: External ear normal.  Left Ear: External ear normal.  Mouth/Throat: Oropharynx is clear and moist.  Eyes: Conjunctivae are normal. Pupils are equal, round, and reactive to light.  Discs flat  Neck: Normal range of motion. Neck supple. No thyromegaly present.  Cardiovascular: Normal rate, regular rhythm, normal heart sounds and intact distal pulses.   Pulmonary/Chest: Effort normal and breath sounds  normal. No respiratory distress.  Abdominal: Soft. Bowel sounds are normal.  Musculoskeletal: Normal range of motion. She exhibits no edema.  Lymphadenopathy:    She has no cervical adenopathy.  Neurological: She is alert and oriented to person, place, and time. She displays normal reflexes.  Gait normal.  Reflexes brisk  Skin: Skin is warm and dry.  Psychiatric: She has a normal mood and affect. Her behavior is normal. Thought content normal.  Nursing note and vitals reviewed.  ASSESSMENT/PLAN:   1. Severe uncontrolled hypertension  - CBC - COMPLETE METABOLIC PANEL WITH GFR - Lipid panel - Urinalysis, Routine w reflex microscopic - TSH - EKG 12-Lead - DG Chest 2 View; Future Greater than 50% of this visit was spent in counseling and coordinating care.  Total face to face time:  45 min.  BP dangerously high.  Low salt diet advised.  Use birth control - do not want to get pregnant right now.  Medicines reviewed.  Testing ordered.  Follow up arranged   Patient Instructions  Stay on low salt diet  Take the lisinopril / HCTZ for your blood pressure Take 1/2 a pill twice a day for 3 days then one a day  Come back in one week for BP check  Need labs today Need chest x ray  See me in 2 weeks  Need old records gynecology   Eustace Moore, MD

## 2017-02-28 NOTE — Patient Instructions (Signed)
Stay on low salt diet  Take the lisinopril / HCTZ for your blood pressure Take 1/2 a pill twice a day for 3 days then one a day  Come back in one week for BP check  Need labs today Need chest x ray  See me in 2 weeks  Need old records gynecology

## 2017-03-01 ENCOUNTER — Encounter: Payer: Self-pay | Admitting: Family Medicine

## 2017-03-01 LAB — URINALYSIS, ROUTINE W REFLEX MICROSCOPIC
BILIRUBIN URINE: NEGATIVE
Glucose, UA: NEGATIVE
Ketones, ur: NEGATIVE
Nitrite: NEGATIVE
SPECIFIC GRAVITY, URINE: 1.015 (ref 1.001–1.035)
pH: 7 (ref 5.0–8.0)

## 2017-03-01 LAB — URINALYSIS, MICROSCOPIC ONLY
Casts: NONE SEEN [LPF]
Crystals: NONE SEEN [HPF]
Yeast: NONE SEEN [HPF]

## 2017-03-01 LAB — COMPLETE METABOLIC PANEL WITH GFR
ALBUMIN: 5 g/dL (ref 3.6–5.1)
ALK PHOS: 29 U/L — AB (ref 33–115)
ALT: 9 U/L (ref 6–29)
AST: 14 U/L (ref 10–30)
BILIRUBIN TOTAL: 0.9 mg/dL (ref 0.2–1.2)
BUN: 11 mg/dL (ref 7–25)
CO2: 26 mmol/L (ref 20–31)
CREATININE: 0.94 mg/dL (ref 0.50–1.10)
Calcium: 9.8 mg/dL (ref 8.6–10.2)
Chloride: 100 mmol/L (ref 98–110)
GFR, EST NON AFRICAN AMERICAN: 79 mL/min (ref >=60)
GLUCOSE: 88 mg/dL (ref 65–99)
Potassium: 3.3 mmol/L — ABNORMAL LOW (ref 3.5–5.3)
Sodium: 139 mmol/L (ref 135–146)
Total Protein: 7.7 g/dL (ref 6.1–8.1)

## 2017-03-01 LAB — CBC
HEMATOCRIT: 43.1 % (ref 35.0–45.0)
Hemoglobin: 14.8 g/dL (ref 11.7–15.5)
MCH: 31.8 pg (ref 27.0–33.0)
MCHC: 34.3 g/dL (ref 32.0–36.0)
MCV: 92.5 fL (ref 80.0–100.0)
MPV: 10.1 fL (ref 7.5–12.5)
Platelets: 331 10*3/uL (ref 140–400)
RBC: 4.66 MIL/uL (ref 3.80–5.10)
RDW: 12.9 % (ref 11.0–15.0)
WBC: 9.2 10*3/uL (ref 3.8–10.8)

## 2017-03-01 LAB — LIPID PANEL
Cholesterol: 205 mg/dL — ABNORMAL HIGH (ref <200)
HDL: 45 mg/dL — AB (ref >50)
LDL Cholesterol: 126 mg/dL — ABNORMAL HIGH (ref <100)
TRIGLYCERIDES: 172 mg/dL — AB (ref <150)
Total CHOL/HDL Ratio: 4.6 Ratio (ref <5.0)
VLDL: 34 mg/dL — ABNORMAL HIGH (ref <30)

## 2017-03-01 LAB — TSH: TSH: 1.19 m[IU]/L

## 2017-03-01 NOTE — Addendum Note (Signed)
Addended by: Crawford GivensPUTMAN, Giara Mcgaughey M on: 03/01/2017 02:53 PM   Modules accepted: Orders

## 2017-03-10 ENCOUNTER — Telehealth: Payer: Self-pay | Admitting: Family Medicine

## 2017-03-10 ENCOUNTER — Ambulatory Visit: Payer: 59

## 2017-03-10 VITALS — BP 104/72

## 2017-03-10 DIAGNOSIS — I10 Essential (primary) hypertension: Secondary | ICD-10-CM

## 2017-03-10 NOTE — Telephone Encounter (Signed)
Patient called in to speak to someone about her blood pressure medication, she said she was having dizzy spells and seeing spots.  She took her BP at 3 and it was 86/55. I spoke w/ selena and she said patient needs to come in to have her BP checked and give a urine sample.  Patient states she will be in the office in 35 min.  [phone]

## 2017-03-11 LAB — URINE CULTURE

## 2017-03-16 ENCOUNTER — Ambulatory Visit: Payer: 59 | Admitting: Family Medicine

## 2017-03-31 ENCOUNTER — Encounter: Payer: Self-pay | Admitting: Family Medicine

## 2017-03-31 ENCOUNTER — Ambulatory Visit (INDEPENDENT_AMBULATORY_CARE_PROVIDER_SITE_OTHER): Payer: 59 | Admitting: Family Medicine

## 2017-03-31 VITALS — BP 126/76 | HR 100 | Temp 98.5°F | Resp 16 | Ht 65.0 in | Wt 152.0 lb

## 2017-03-31 DIAGNOSIS — I10 Essential (primary) hypertension: Secondary | ICD-10-CM | POA: Diagnosis not present

## 2017-03-31 NOTE — Patient Instructions (Signed)
Watch for dehydration - drink enough water You are doing well See me in six months Need blood work next time

## 2017-03-31 NOTE — Progress Notes (Signed)
    Chief Complaint  Patient presents with  . Follow-up   Doing well on BP medicine No more headache or dizziness BP is good today Is compliant with daily medicine   Patient Active Problem List   Diagnosis Date Noted  . Essential hypertension 03/31/2017    Outpatient Encounter Prescriptions as of 03/31/2017  Medication Sig  . acetaminophen (TYLENOL) 500 MG tablet Take 1,000 mg by mouth every 6 (six) hours as needed.  Marland Kitchen. lisinopril-hydrochlorothiazide (PRINZIDE,ZESTORETIC) 20-25 MG tablet Take 1 tablet by mouth daily.  . potassium chloride SA (K-DUR,KLOR-CON) 20 MEQ tablet Take 1 tablet (20 mEq total) by mouth daily.   No facility-administered encounter medications on file as of 03/31/2017.     Allergies  Allergen Reactions  . Peanut-Containing Drug Products Hives  . Codeine Rash    Review of Systems  Constitutional: Negative for activity change, appetite change and unexpected weight change.  HENT: Negative for congestion, dental problem, postnasal drip and rhinorrhea.   Eyes: Negative for redness and visual disturbance.  Respiratory: Negative for cough and shortness of breath.   Cardiovascular: Negative for chest pain, palpitations and leg swelling.  Gastrointestinal: Negative for abdominal pain, constipation and diarrhea.  Genitourinary: Negative for difficulty urinating, frequency and menstrual problem.  Musculoskeletal: Negative for arthralgias and back pain.  Neurological: Negative for dizziness and headaches.  Psychiatric/Behavioral: Negative for dysphoric mood and sleep disturbance. The patient is not nervous/anxious.     BP 126/76 (BP Location: Right Arm, Patient Position: Sitting, Cuff Size: Normal)   Pulse 100   Temp 98.5 F (36.9 C) (Temporal)   Resp 16   Ht 5\' 5"  (1.651 m)   Wt 152 lb 0.6 oz (69 kg)   LMP 03/27/2017   SpO2 99%   BMI 25.30 kg/m   Physical Exam  Constitutional: She is oriented to person, place, and time. She appears well-developed and  well-nourished. No distress.  HENT:  Head: Normocephalic and atraumatic.  Mouth/Throat: Oropharynx is clear and moist.  Neck: Normal range of motion.  Cardiovascular: Normal rate, regular rhythm and normal heart sounds.   Pulmonary/Chest: Effort normal and breath sounds normal. No respiratory distress.  Musculoskeletal: Normal range of motion. She exhibits no edema.  Neurological: She is alert and oriented to person, place, and time.  Psychiatric: She has a normal mood and affect. Her behavior is normal. Thought content normal.    ASSESSMENT/PLAN:  1. Essential hypertension Well controlled   Patient Instructions  Watch for dehydration - drink enough water You are doing well See me in six months Need blood work next time   Eustace MooreYvonne Sue Ily Denno, MD

## 2017-04-27 ENCOUNTER — Telehealth: Payer: Self-pay | Admitting: *Deleted

## 2017-04-27 NOTE — Telephone Encounter (Signed)
Patient informed that recalled med was valsartan

## 2017-04-27 NOTE — Telephone Encounter (Signed)
Patient called and states she seen where some Blood pressure medication was on a recall and she was wanting to make sure that her medication was not on that list. She takes Lisinopril Hydrochlorothiazide. Patient contact number is [phone]. Please advise. Thank you

## 2017-05-19 ENCOUNTER — Ambulatory Visit (HOSPITAL_COMMUNITY)
Admission: RE | Admit: 2017-05-19 | Discharge: 2017-05-19 | Disposition: A | Discharge status: Home / Self Care | Payer: 59 | Source: Ambulatory Visit | Attending: Family Medicine | Admitting: Family Medicine

## 2017-05-19 ENCOUNTER — Encounter: Payer: Self-pay | Admitting: Family Medicine

## 2017-05-19 ENCOUNTER — Ambulatory Visit (INDEPENDENT_AMBULATORY_CARE_PROVIDER_SITE_OTHER): Payer: 59 | Admitting: Family Medicine

## 2017-05-19 DIAGNOSIS — F411 Generalized anxiety disorder: Secondary | ICD-10-CM | POA: Insufficient documentation

## 2017-05-19 DIAGNOSIS — S161XXA Strain of muscle, fascia and tendon at neck level, initial encounter: Secondary | ICD-10-CM

## 2017-05-19 DIAGNOSIS — M5412 Radiculopathy, cervical region: Secondary | ICD-10-CM

## 2017-05-19 HISTORY — DX: Strain of muscle, fascia and tendon at neck level, initial encounter: S16.1XXA

## 2017-05-19 MED ORDER — IBUPROFEN 800 MG PO TABS: 800.0000 mg | ORAL_TABLET | Freq: Three times a day (TID) | ORAL | 3 refills | Status: DC | PRN

## 2017-05-19 MED ORDER — IBUPROFEN 800 MG PO TABS: 800.0000 mg | ORAL_TABLET | Freq: Three times a day (TID) | ORAL | 3 refills | Status: AC | PRN

## 2017-05-19 MED ORDER — ESCITALOPRAM OXALATE 10 MG PO TABS: 10.0000 mg | ORAL_TABLET | Freq: Every day | ORAL | 3 refills | Status: DC

## 2017-05-19 MED ORDER — CYCLOBENZAPRINE HCL 5 MG PO TABS: 5.0000 mg | ORAL_TABLET | Freq: Three times a day (TID) | ORAL | 1 refills | Status: DC | PRN

## 2017-05-19 MED ORDER — CYCLOBENZAPRINE HCL 5 MG PO TABS: 5.0000 mg | ORAL_TABLET | Freq: Three times a day (TID) | ORAL | 1 refills | Status: AC | PRN

## 2017-05-19 NOTE — Patient Instructions (Signed)
Take the ibuprofen 3 times a day with food This is for neck pain Take the flexeril at bedtime This is a muscle relaxer Watch for drowsiness Ice to painful muscle spasm  Take the escitalopram daily Call for side effects or problems  See me in one month  Go for a neck x ray

## 2017-05-19 NOTE — Progress Notes (Signed)
Chief Complaint  Patient presents with  . Shoulder Pain    left   Here with 2 new complaints 1. Neck pain on the L with radiation into the upper L arm and numbness of the skin over the tricep.  Off and on for a year, recurrent spells of pain.  Sometimes aggravated by lifting.  Hard to sleep.  No numbness or weakness into the hand.  No trauma or fall.  Does not recall significant neck injury in the past.  The neck muscle gets tight and swollen.  Currently in pain since Wednesday.  Uses a heating pad. 2. Anxiety and depression.  Is irritable.  Cries easily.  Took zoloft for a while after her 534 yo son was born.  Worries a lot.  Cannot sleep.  Feels bad if she is short tempered with the kids.  No thoughts of harming self.  No headache or somatic symptoms.  Weight is stable.  Patient Active Problem List   Diagnosis Date Noted  . Cervical myofascial strain 05/19/2017  . Acute cervical radiculopathy 05/19/2017  . Generalized anxiety disorder 05/19/2017  . Essential hypertension 03/31/2017    Outpatient Encounter Prescriptions as of 05/19/2017  Medication Sig  . acetaminophen (TYLENOL) 500 MG tablet Take 1,000 mg by mouth every 6 (six) hours as needed.  Marland Kitchen. lisinopril-hydrochlorothiazide (PRINZIDE,ZESTORETIC) 20-25 MG tablet Take 1 tablet by mouth daily.  . potassium chloride SA (K-DUR,KLOR-CON) 20 MEQ tablet Take 1 tablet (20 mEq total) by mouth daily.  . cyclobenzaprine (FLEXERIL) 5 MG tablet Take 1 tablet (5 mg total) by mouth 3 (three) times daily as needed for muscle spasms.  Marland Kitchen. escitalopram (LEXAPRO) 10 MG tablet Take 1 tablet (10 mg total) by mouth daily.  Marland Kitchen. ibuprofen (ADVIL,MOTRIN) 800 MG tablet Take 1 tablet (800 mg total) by mouth every 8 (eight) hours as needed for moderate pain.   No facility-administered encounter medications on file as of 05/19/2017.     Allergies  Allergen Reactions  . Peanut-Containing Drug Products Hives  . Codeine Rash    Review of Systems    Constitutional: Negative for activity change, appetite change and unexpected weight change.  HENT: Negative for congestion, dental problem, postnasal drip and rhinorrhea.   Eyes: Negative for redness and visual disturbance.  Respiratory: Negative for cough and shortness of breath.   Cardiovascular: Negative for chest pain, palpitations and leg swelling.  Gastrointestinal: Negative for abdominal pain, constipation and diarrhea.  Genitourinary: Negative for difficulty urinating, frequency and menstrual problem.  Musculoskeletal: Positive for neck pain and neck stiffness. Negative for arthralgias and back pain.  Neurological: Negative for dizziness and headaches.  Psychiatric/Behavioral: Positive for decreased concentration, dysphoric mood and sleep disturbance. Negative for self-injury and suicidal ideas. The patient is nervous/anxious.     BP 124/76 (BP Location: Right Arm, Patient Position: Sitting, Cuff Size: Normal)   Pulse 96   Temp 98.6 F (37 C) (Temporal)   Resp 16   Ht 5\' 5"  (1.651 m)   Wt 153 lb 0.6 oz (69.4 kg)   LMP 04/20/2017   SpO2 99%   BMI 25.47 kg/m   Physical Exam  Constitutional: She appears well-developed and well-nourished. No distress.  HENT:  Head: Normocephalic and atraumatic.  Mouth/Throat: Oropharynx is clear and moist.  Eyes: Pupils are equal, round, and reactive to light. Conjunctivae are normal.  Neck: Normal range of motion. Neck supple. Muscular tenderness present.    Cardiovascular: Normal rate, regular rhythm and normal heart sounds.   Pulmonary/Chest: Effort  normal and breath sounds normal.  Musculoskeletal: Normal range of motion. She exhibits no edema.  Strength sensation ROM and reflexes are intact both UE.  FROM of shoudlers  Psychiatric: She has a normal mood and affect. Her behavior is normal. Thought content normal.    ASSESSMENT/PLAN:  1. Cervical myofascial strain, initial encounter - DG Cervical Spine Complete; Future  2. 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 Generalized anxiety disorder Discussed diet, exercise, sleep, SSRI medicines and expectations   Patient Instructions  Take the ibuprofen 3 times a day with food This is for neck pain Take the flexeril at bedtime This is a muscle relaxer Watch for drowsiness Ice to painful muscle spasm  Take the escitalopram daily Call for side effects or problems  See me in one month  Go for a neck x ray   Eustace Moore, MD

## 2017-05-22 ENCOUNTER — Telehealth: Payer: Self-pay | Admitting: Family Medicine

## 2017-05-22 MED ORDER — CITALOPRAM HYDROBROMIDE 20 MG PO TABS: 20.0000 mg | ORAL_TABLET | Freq: Every day | ORAL | 3 refills | Status: DC

## 2017-05-22 NOTE — Telephone Encounter (Signed)
Patient calling in reference to her prescriptions @ [facility] in [place].  She is asking if there is anything cheaper to take. (total is $270.00)  She is unable to afford.  Please call and let her know if something else is called in.

## 2017-05-22 NOTE — Telephone Encounter (Signed)
Citalopram 20 a day instead of escitalopram 10 a day.

## 2017-05-22 NOTE — Telephone Encounter (Signed)
Called Chasady, voice mail not set up

## 2017-05-22 NOTE — Telephone Encounter (Signed)
Done, Oriah aware.

## 2017-05-23 ENCOUNTER — Telehealth: Payer: Self-pay | Admitting: *Deleted

## 2017-05-23 MED ORDER — CITALOPRAM HYDROBROMIDE 20 MG PO TABS: 20.0000 mg | ORAL_TABLET | Freq: Every day | ORAL | 3 refills | Status: AC

## 2017-05-23 NOTE — Telephone Encounter (Signed)
Done, Fidelis aware. 

## 2017-05-23 NOTE — Telephone Encounter (Signed)
Patient has called left message yesterday stating her medication that was refilled to [facility] needs to be sent to [facility]. Please advise [phone]

## 2017-05-25 ENCOUNTER — Telehealth: Payer: Self-pay | Admitting: Family Medicine

## 2017-05-25 NOTE — Telephone Encounter (Signed)
Patient informed of message below, verbalized understanding.  

## 2017-05-25 NOTE — Telephone Encounter (Signed)
Normal.  Ask if she has My Chart still active?  I released it to her there.

## 2017-05-25 NOTE — Telephone Encounter (Signed)
Patient calling to find out the results of Xray done on Friday @ [facility] for her neck  cb  [phone]

## 2017-06-09 ENCOUNTER — Ambulatory Visit: Payer: 59 | Admitting: Family Medicine

## 2017-06-20 ENCOUNTER — Ambulatory Visit: Payer: 59 | Admitting: Family Medicine

## 2017-06-29 ENCOUNTER — Ambulatory Visit: Payer: Self-pay | Admitting: Family Medicine

## 2017-09-29 ENCOUNTER — Ambulatory Visit: Payer: 59 | Admitting: Family Medicine

## 2017-10-13 ENCOUNTER — Ambulatory Visit: Payer: 59 | Admitting: Family Medicine

## 2018-04-11 ENCOUNTER — Other Ambulatory Visit: Payer: Self-pay | Admitting: Family Medicine

## 2018-05-23 ENCOUNTER — Other Ambulatory Visit: Payer: Self-pay | Admitting: Family Medicine

## 2018-11-21 ENCOUNTER — Other Ambulatory Visit: Payer: Self-pay

## 2018-11-21 ENCOUNTER — Encounter (HOSPITAL_COMMUNITY): Payer: Self-pay

## 2018-11-21 ENCOUNTER — Emergency Department (HOSPITAL_COMMUNITY)
Admission: EM | Admit: 2018-11-21 | Discharge: 2018-11-21 | Disposition: A | Discharge status: Home / Self Care | Payer: 59 | Attending: Emergency Medicine | Admitting: Emergency Medicine

## 2018-11-21 ENCOUNTER — Emergency Department (HOSPITAL_COMMUNITY): Payer: 59

## 2018-11-21 DIAGNOSIS — F410 Panic disorder [episodic paroxysmal anxiety] without agoraphobia: Secondary | ICD-10-CM | POA: Insufficient documentation

## 2018-11-21 DIAGNOSIS — I10 Essential (primary) hypertension: Secondary | ICD-10-CM | POA: Diagnosis present

## 2018-11-21 DIAGNOSIS — Z79899 Other long term (current) drug therapy: Secondary | ICD-10-CM | POA: Insufficient documentation

## 2018-11-21 DIAGNOSIS — F17210 Nicotine dependence, cigarettes, uncomplicated: Secondary | ICD-10-CM | POA: Diagnosis not present

## 2018-11-21 DIAGNOSIS — Z91010 Allergy to peanuts: Secondary | ICD-10-CM | POA: Insufficient documentation

## 2018-11-21 LAB — CBC
HEMATOCRIT: 44.2 % (ref 36.0–46.0)
Hemoglobin: 14.6 g/dL (ref 12.0–15.0)
MCH: 31.7 pg (ref 26.0–34.0)
MCHC: 33 g/dL (ref 30.0–36.0)
MCV: 95.9 fL (ref 80.0–100.0)
NRBC: 0 % (ref 0.0–0.2)
PLATELETS: 190 10*3/uL (ref 150–400)
RBC: 4.61 MIL/uL (ref 3.87–5.11)
RDW: 12.5 % (ref 11.5–15.5)
WBC: 4.6 10*3/uL (ref 4.0–10.5)

## 2018-11-21 LAB — BASIC METABOLIC PANEL
Anion gap: 9 (ref 5–15)
BUN: 7 mg/dL (ref 6–20)
CALCIUM: 9.2 mg/dL (ref 8.9–10.3)
CO2: 25 mmol/L (ref 22–32)
Chloride: 105 mmol/L (ref 98–111)
Creatinine, Ser: 0.62 mg/dL (ref 0.44–1.00)
Glucose, Bld: 99 mg/dL (ref 70–99)
Potassium: 3.6 mmol/L (ref 3.5–5.1)
SODIUM: 139 mmol/L (ref 135–145)

## 2018-11-21 LAB — I-STAT TROPONIN, ED: TROPONIN I, POC: 0 ng/mL (ref 0.00–0.08)

## 2018-11-21 NOTE — Discharge Instructions (Addendum)
Work-up for the chest pain without any acute findings.  To include negative chest x-ray.  Follow-up with your blood pressure.  If it does not level out on your blood pressure medicine that is important to follow-up with your doctors and make an appointment.

## 2018-11-21 NOTE — ED Triage Notes (Signed)
Pt reports has history of panic attacks.  Reports she had panic attack last night and called ems.  Pt says has been under a lot of stress for the past few months.  Reports has 2 children and is going through a separation.  Pt says she called ems last night and they told her that her bp was elevated.  PT says felt like she was having a heart attack.  This morning pt says she felt better but not back to normal.  Pt reports chest pain, heaviness, and tingling in fingers.

## 2018-11-21 NOTE — ED Provider Notes (Signed)
[facility]   CSN: [id number] Arrival date & time: 11/21/18  0920     History   Chief Complaint Chief Complaint  Patient presents with  . Hypertension    HPI Erika Cain is a 38 y.o. female.  Patient presenting with a complaint of blood pressure being high having some anxiety and some chest discomfort.  Patient has a history of anxiety is been told that she does have panic attacks at times.  Blood pressure here with a systolic pressure of 133.  Patient feeling better currently not showing it evidence of panic attack at this point in time.  No fevers no upper respiratory symptoms.  No leg swelling.     Past Medical History:  Diagnosis Date  . Anxiety   . Cervical myofascial strain 05/19/2017  . Depression   . Gestational hypertension 02/28/2017  . Hypertension     Patient Active Problem List   Diagnosis Date Noted  . Cervical myofascial strain 05/19/2017  . Acute cervical radiculopathy 05/19/2017  . Generalized anxiety disorder 05/19/2017  . Essential hypertension 03/31/2017    History reviewed. No pertinent surgical history.   OB History   No obstetric history on file.      Home Medications    Prior to Admission medications   Medication Sig Start Date End Date Taking? Authorizing Provider  acetaminophen (TYLENOL) 500 MG tablet Take 1,000 mg by mouth every 6 (six) hours as needed.   Yes [provider]  citalopram (CELEXA) 20 MG tablet Take 1 tablet (20 mg total) by mouth daily. 05/23/17  Yes Erika Cain, Erika Sue, MD  lisinopril (PRINIVIL,ZESTRIL) 20 MG tablet Take 20 mg by mouth daily.   Yes [provider]  cyclobenzaprine (FLEXERIL) 5 MG tablet Take 1 tablet (5 mg total) by mouth 3 (three) times daily as needed for muscle spasms. 05/19/17   Erika Cain, Erika Sue, MD  ibuprofen (ADVIL,MOTRIN) 800 MG tablet Take 1 tablet (800 mg total) by mouth every 8 (eight) hours as needed for moderate pain. 05/19/17   Erika Cain,  Erika Sue, MD    Family History Family History  Problem Relation Age of Onset  . Hypertension Mother   . Arthritis Mother   . Depression Mother   . Drug abuse Mother   . Hypertension Father   . Diabetes Father   . Seizures Father        TBI  . Hyperlipidemia Father   . Heart disease Maternal Grandmother   . Early death Maternal Grandmother [id number]       heart attack  . Mental illness Maternal Grandfather        suicide  . Arthritis Maternal Grandfather   . Cancer Paternal Grandfather [id number]       pancreatic and lung    Social History Social History   Tobacco Use  . Smoking status: Current Every Day Smoker    Last attempt to quit: 2013    Years since quitting: 7.1  . Smokeless tobacco: Never Used  Substance Use Topics  . Alcohol use: No  . Drug use: No     Allergies   Peanut-containing drug products and Codeine   Review of Systems Review of Systems  Constitutional: Negative for chills and fever.  HENT: Negative for congestion, rhinorrhea and sore throat.   Eyes: Negative for visual disturbance.  Respiratory: Negative for cough and shortness of breath.   Cardiovascular: Positive for chest pain. Negative for leg swelling.  Gastrointestinal: Negative for abdominal pain, diarrhea, nausea  and vomiting.  Genitourinary: Negative for dysuria.  Musculoskeletal: Negative for back pain and neck pain.  Skin: Negative for rash.  Neurological: Negative for dizziness, light-headedness and headaches.  Hematological: Does not bruise/bleed easily.  Psychiatric/Behavioral: Negative for confusion. The patient is nervous/anxious.      Physical Exam Updated Vital Signs BP (!) 133/102 (BP Location: Right Arm)   Pulse 98   Temp 98.4 F (36.9 C) (Oral)   Resp 20   Ht 1.626 m (5\' 4" )   Wt 59 kg   LMP 11/08/2018   SpO2 99%   BMI 22.31 kg/m   Physical Exam Vitals signs and nursing note reviewed.  Constitutional:      General: She is not in acute distress.    Appearance: She  is well-developed.  HENT:     Head: Normocephalic and atraumatic.     Nose: No congestion.  Eyes:     Extraocular Movements: Extraocular movements intact.     Conjunctiva/sclera: Conjunctivae normal.     Pupils: Pupils are equal, round, and reactive to light.  Neck:     Musculoskeletal: Normal range of motion and neck supple.  Cardiovascular:     Rate and Rhythm: Normal rate and regular rhythm.     Heart sounds: Normal heart sounds. No murmur.  Pulmonary:     Effort: Pulmonary effort is normal. No respiratory distress.     Breath sounds: Normal breath sounds.  Abdominal:     General: Bowel sounds are normal.     Palpations: Abdomen is soft.     Tenderness: There is no abdominal tenderness.  Musculoskeletal: Normal range of motion.  Skin:    General: Skin is warm and dry.     Capillary Refill: Capillary refill takes less than 2 seconds.  Neurological:     General: No focal deficit present.     Mental Status: She is alert and oriented to person, place, and time.     Cranial Nerves: No cranial nerve deficit.     Sensory: No sensory deficit.     Motor: No weakness.      ED Treatments / Results  Labs (all labs ordered are listed, but only abnormal results are displayed) Labs Reviewed  CBC  BASIC METABOLIC PANEL  I-STAT TROPONIN, ED    EKG EKG Interpretation  Date/Time:  Wednesday November 21 2018 09:42:36 EST Ventricular Rate:  113 PR Interval:  122 QRS Duration: 84 QT Interval:  326 QTC Calculation: 447 R Axis:   106 Text Interpretation:  Sinus tachycardia Right atrial enlargement Rightward axis Nonspecific T wave abnormality Abnormal ECG Confirmed by Vanetta Mulders [phone]) on 11/21/2018 10:04:50 AM   Radiology Dg Chest 2 View  Result Date: 11/21/2018 CLINICAL DATA:  Acute chest pain. EXAM: CHEST - 2 VIEW COMPARISON:  01/30/2014 and prior radiograph FINDINGS: The cardiomediastinal silhouette is unremarkable. There is no evidence of focal airspace disease,  pulmonary edema, suspicious pulmonary nodule/mass, pleural effusion, or pneumothorax. No acute bony abnormalities are identified. IMPRESSION: No active cardiopulmonary disease. Electronically Signed   By: Harmon Pier M.D.   On: 11/21/2018 12:11    Procedures Procedures (including critical care time)  Medications Ordered in ED Medications - No data to display   Initial Impression / Assessment and Plan / ED Course  I have reviewed the triage vital signs and the nursing notes.  Pertinent labs & imaging results that were available during my care of the patient were reviewed by me and considered in my medical decision making (see chart  for details).   Patient final diagnosis seems to be consistent with essential hypertension and panic attack.  No acute findings otherwise for the chest pain.  Chest x-ray was negative.  EKG showed some sinus tachycardia with heart rate of 113 but no other acute findings.  Lab work-up including troponin were negative.    Final Clinical Impressions(s) / ED Diagnoses   Final diagnoses:  Essential hypertension  Panic attack    ED Discharge Orders    None       Vanetta Mulders, MD 11/30/18 [phone]

## 2019-06-30 DIAGNOSIS — N200 Calculus of kidney: Secondary | ICD-10-CM | POA: Diagnosis not present

## 2019-06-30 DIAGNOSIS — Z79899 Other long term (current) drug therapy: Secondary | ICD-10-CM | POA: Diagnosis not present

## 2019-06-30 DIAGNOSIS — F419 Anxiety disorder, unspecified: Secondary | ICD-10-CM | POA: Diagnosis not present

## 2019-06-30 DIAGNOSIS — I10 Essential (primary) hypertension: Secondary | ICD-10-CM | POA: Diagnosis not present

## 2019-06-30 DIAGNOSIS — N10 Acute tubulo-interstitial nephritis: Secondary | ICD-10-CM | POA: Diagnosis not present

## 2019-06-30 DIAGNOSIS — Z466 Encounter for fitting and adjustment of urinary device: Secondary | ICD-10-CM | POA: Diagnosis not present

## 2019-06-30 DIAGNOSIS — A419 Sepsis, unspecified organism: Secondary | ICD-10-CM | POA: Diagnosis not present

## 2019-06-30 DIAGNOSIS — N201 Calculus of ureter: Secondary | ICD-10-CM | POA: Diagnosis not present

## 2019-06-30 DIAGNOSIS — R109 Unspecified abdominal pain: Secondary | ICD-10-CM | POA: Diagnosis not present

## 2019-06-30 DIAGNOSIS — Z20828 Contact with and (suspected) exposure to other viral communicable diseases: Secondary | ICD-10-CM | POA: Diagnosis not present

## 2019-08-01 DIAGNOSIS — Z01818 Encounter for other preprocedural examination: Secondary | ICD-10-CM | POA: Diagnosis not present

## 2019-08-01 DIAGNOSIS — N200 Calculus of kidney: Secondary | ICD-10-CM | POA: Diagnosis not present

## 2019-08-06 DIAGNOSIS — F17200 Nicotine dependence, unspecified, uncomplicated: Secondary | ICD-10-CM | POA: Diagnosis not present

## 2019-08-06 DIAGNOSIS — I10 Essential (primary) hypertension: Secondary | ICD-10-CM | POA: Diagnosis not present

## 2019-08-06 DIAGNOSIS — Z9889 Other specified postprocedural states: Secondary | ICD-10-CM | POA: Diagnosis not present

## 2019-08-06 DIAGNOSIS — Z87442 Personal history of urinary calculi: Secondary | ICD-10-CM | POA: Diagnosis not present

## 2019-08-06 DIAGNOSIS — N2889 Other specified disorders of kidney and ureter: Secondary | ICD-10-CM | POA: Diagnosis not present

## 2019-08-06 DIAGNOSIS — Z885 Allergy status to narcotic agent status: Secondary | ICD-10-CM | POA: Diagnosis not present

## 2019-08-06 DIAGNOSIS — Z79899 Other long term (current) drug therapy: Secondary | ICD-10-CM | POA: Diagnosis not present

## 2019-08-06 DIAGNOSIS — N200 Calculus of kidney: Secondary | ICD-10-CM | POA: Diagnosis not present

## 2019-08-06 DIAGNOSIS — F419 Anxiety disorder, unspecified: Secondary | ICD-10-CM | POA: Diagnosis not present

## 2019-08-06 DIAGNOSIS — Z881 Allergy status to other antibiotic agents status: Secondary | ICD-10-CM | POA: Diagnosis not present

## 2019-08-06 DIAGNOSIS — Z960 Presence of urogenital implants: Secondary | ICD-10-CM | POA: Diagnosis not present

## 2019-08-06 DIAGNOSIS — Z466 Encounter for fitting and adjustment of urinary device: Secondary | ICD-10-CM | POA: Diagnosis not present

## 2019-10-24 DIAGNOSIS — F329 Major depressive disorder, single episode, unspecified: Secondary | ICD-10-CM | POA: Diagnosis not present

## 2019-10-24 DIAGNOSIS — Z87442 Personal history of urinary calculi: Secondary | ICD-10-CM | POA: Diagnosis not present

## 2019-10-24 DIAGNOSIS — F419 Anxiety disorder, unspecified: Secondary | ICD-10-CM | POA: Diagnosis not present

## 2019-10-24 DIAGNOSIS — I10 Essential (primary) hypertension: Secondary | ICD-10-CM | POA: Diagnosis not present

## 2020-04-21 IMAGING — DX DG CHEST 2V
2 series · 2 of 2 positions shown · non-contrast
Comparison: 01/30/2014 and prior radiograph

CLINICAL DATA: Acute chest pain.

EXAM:
CHEST - 2 VIEW

[chest pa]
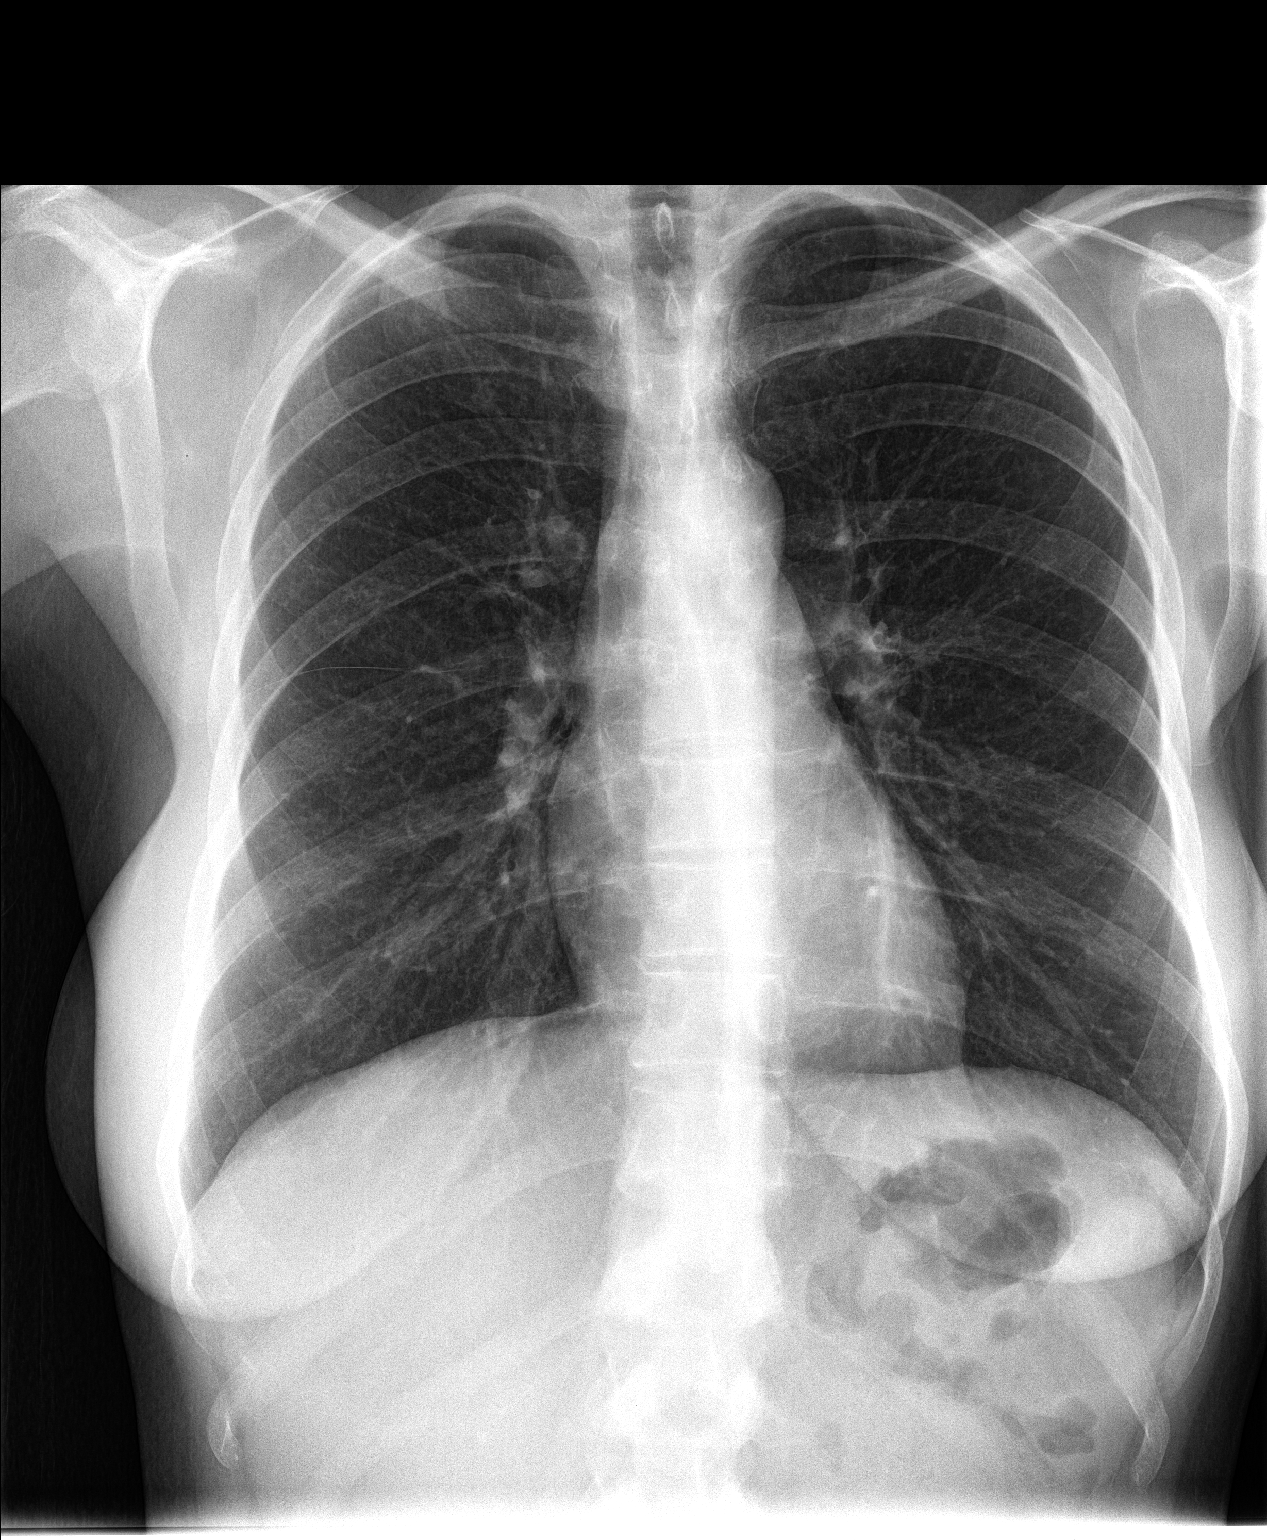

[chest lat]
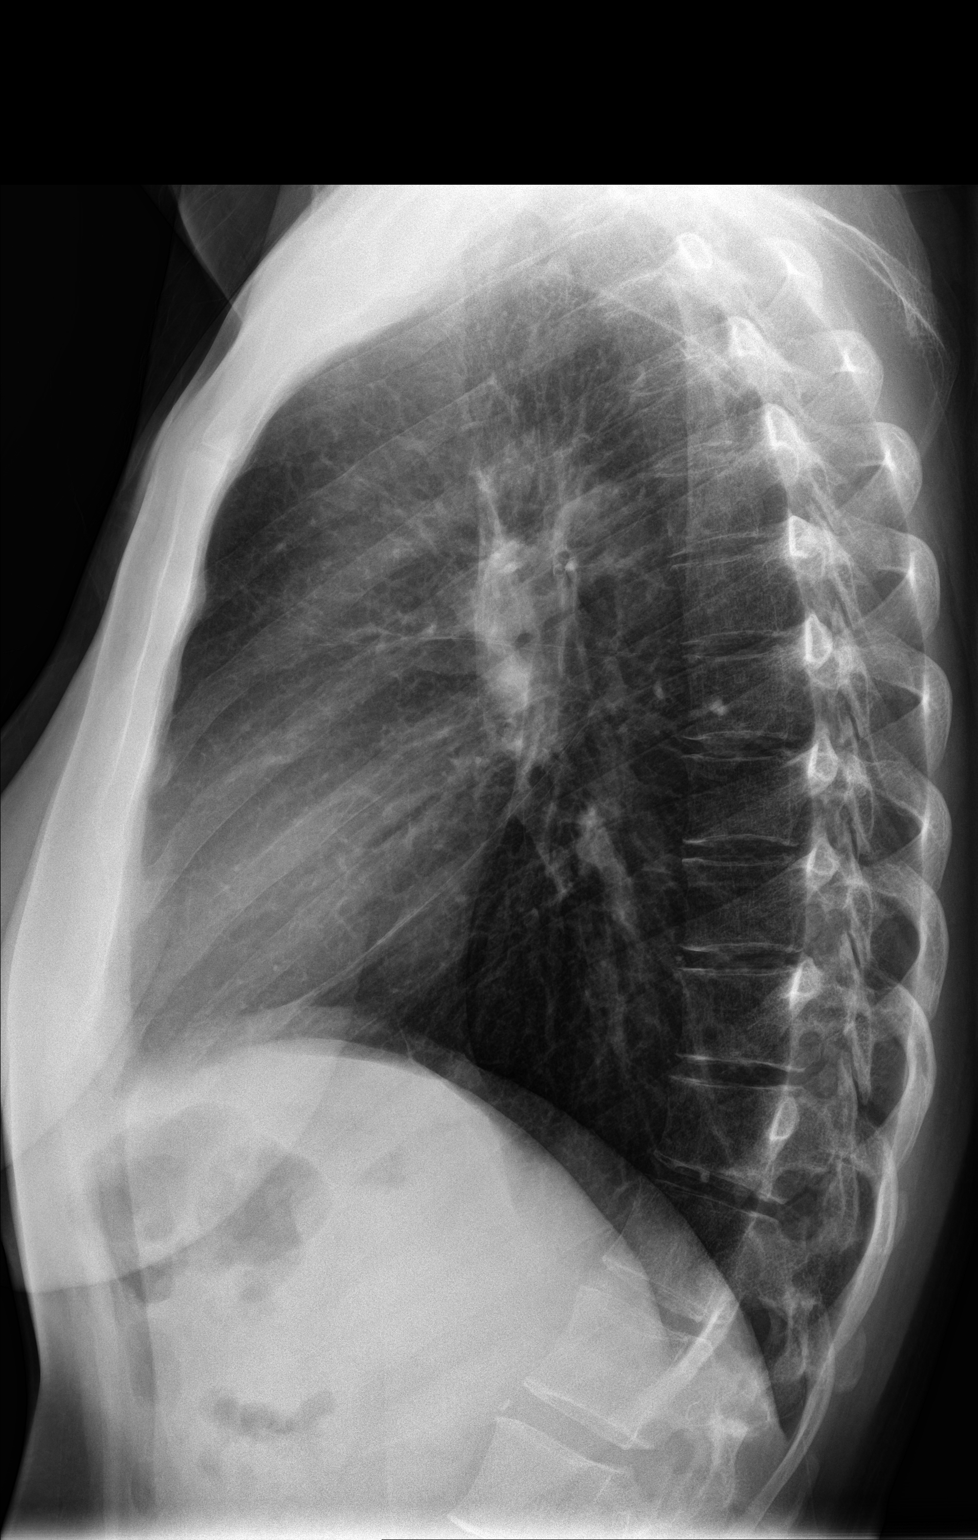

[2 of 2 positions shown; findings below may reference images not displayed]

FINDINGS: The cardiomediastinal silhouette is unremarkable.

There is no evidence of focal airspace disease, pulmonary edema,
suspicious pulmonary nodule/mass, pleural effusion, or pneumothorax.

No acute bony abnormalities are identified.
IMPRESSION: No active cardiopulmonary disease.

## 2020-04-29 DIAGNOSIS — Z87442 Personal history of urinary calculi: Secondary | ICD-10-CM | POA: Diagnosis not present

## 2020-04-29 DIAGNOSIS — I10 Essential (primary) hypertension: Secondary | ICD-10-CM | POA: Diagnosis not present

## 2020-04-29 DIAGNOSIS — F329 Major depressive disorder, single episode, unspecified: Secondary | ICD-10-CM | POA: Diagnosis not present

## 2020-04-29 DIAGNOSIS — M5020 Other cervical disc displacement, unspecified cervical region: Secondary | ICD-10-CM | POA: Diagnosis not present

## 2020-04-29 DIAGNOSIS — Z1389 Encounter for screening for other disorder: Secondary | ICD-10-CM | POA: Diagnosis not present

## 2020-04-29 DIAGNOSIS — F419 Anxiety disorder, unspecified: Secondary | ICD-10-CM | POA: Diagnosis not present

## 2020-04-29 DIAGNOSIS — E7849 Other hyperlipidemia: Secondary | ICD-10-CM | POA: Diagnosis not present

## 2020-04-29 DIAGNOSIS — Z6823 Body mass index (BMI) 23.0-23.9, adult: Secondary | ICD-10-CM | POA: Diagnosis not present

## 2021-06-21 ENCOUNTER — Encounter: Payer: 59 | Admitting: Women's Health

## 2021-07-13 ENCOUNTER — Encounter: Payer: 59 | Admitting: Women's Health

## 2021-11-04 ENCOUNTER — Encounter: Payer: Self-pay | Admitting: Neurology
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
## 2022-01-06 ENCOUNTER — Encounter: Payer: Self-pay | Admitting: Neurology

## 2022-01-06 ENCOUNTER — Ambulatory Visit: Payer: 59 | Admitting: Neurology

## 2022-01-06 ENCOUNTER — Other Ambulatory Visit (INDEPENDENT_AMBULATORY_CARE_PROVIDER_SITE_OTHER): Payer: 59

## 2022-01-06 VITALS — BP 140/99 | HR 105 | Ht 64.0 in | Wt 134.6 lb

## 2022-01-06 DIAGNOSIS — R202 Paresthesia of skin: Secondary | ICD-10-CM | POA: Diagnosis not present

## 2022-01-06 DIAGNOSIS — R200 Anesthesia of skin: Secondary | ICD-10-CM

## 2022-01-06 DIAGNOSIS — R292 Abnormal reflex: Secondary | ICD-10-CM

## 2022-01-06 LAB — VITAMIN B12: Vitamin B-12: 145 pg/mL — ABNORMAL LOW (ref 211–911)

## 2022-01-06 LAB — TSH: TSH: 2.47 u[IU]/mL (ref 0.35–5.50)

## 2022-01-06 NOTE — Patient Instructions (Signed)
Check MRI of cervical spine with and without contrast ?Check nerve conduction study of both legs ?Check B12, TSH ?Further recommendations pending results. ?

## 2022-01-06 NOTE — Telephone Encounter (Signed)
Patient called to check on the status of the request. Okay to reply by Mychart. ?

## 2022-01-07 ENCOUNTER — Other Ambulatory Visit: Payer: Self-pay

## 2022-01-07 MED ORDER — GABAPENTIN 100 MG PO CAPS: 100.0000 mg | ORAL_CAPSULE | Freq: Three times a day (TID) | ORAL | 0 refills | Status: DC

## 2022-01-07 NOTE — Progress Notes (Signed)
Dr.Jaffe recommends Gabapentin 100mg  three times daily. ? ?Patient agreed.  ?

## 2022-01-24 ENCOUNTER — Other Ambulatory Visit: Payer: Self-pay

## 2022-01-24 MED ORDER — GABAPENTIN 100 MG PO CAPS
100.0000 mg | ORAL_CAPSULE | Freq: Three times a day (TID) | ORAL | 0 refills | Status: AC
Start: 2022-01-24 — End: ?

## 2022-04-04 ENCOUNTER — Emergency Department (HOSPITAL_COMMUNITY): Payer: Self-pay

## 2022-04-04 ENCOUNTER — Other Ambulatory Visit: Payer: Self-pay

## 2022-04-04 ENCOUNTER — Encounter (HOSPITAL_COMMUNITY): Payer: Self-pay | Admitting: Emergency Medicine

## 2022-04-04 ENCOUNTER — Emergency Department (HOSPITAL_COMMUNITY)
Admission: EM | Admit: 2022-04-04 | Discharge: 2022-04-04 | Disposition: A | Discharge status: Home / Self Care | Payer: Self-pay | Attending: Emergency Medicine | Admitting: Emergency Medicine

## 2022-04-04 DIAGNOSIS — W1839XA Other fall on same level, initial encounter: Secondary | ICD-10-CM | POA: Insufficient documentation

## 2022-04-04 DIAGNOSIS — I10 Essential (primary) hypertension: Secondary | ICD-10-CM | POA: Insufficient documentation

## 2022-04-04 DIAGNOSIS — S76112A Strain of left quadriceps muscle, fascia and tendon, initial encounter: Secondary | ICD-10-CM | POA: Insufficient documentation

## 2022-04-04 DIAGNOSIS — Z91010 Allergy to peanuts: Secondary | ICD-10-CM | POA: Insufficient documentation

## 2022-04-04 DIAGNOSIS — Y9368 Activity, volleyball (beach) (court): Secondary | ICD-10-CM | POA: Insufficient documentation

## 2022-04-04 DIAGNOSIS — M25562 Pain in left knee: Secondary | ICD-10-CM

## 2022-04-04 MED ORDER — HYDROCODONE-ACETAMINOPHEN 5-325 MG PO TABS: 1.0000 | ORAL_TABLET | Freq: Four times a day (QID) | ORAL | 0 refills | Status: AC | PRN

## 2022-04-04 MED ORDER — HYDROCODONE-ACETAMINOPHEN 5-325 MG PO TABS: 2.0000 | ORAL_TABLET | ORAL | 0 refills | Status: DC | PRN

## 2022-04-04 MED ORDER — HYDROCODONE-ACETAMINOPHEN 5-325 MG PO TABS
2.0000 | ORAL_TABLET | Freq: Once | ORAL | Status: AC
  Administered 2022-04-04: 2 via ORAL
  Filled 2022-04-04: qty 2

## 2022-04-04 MED ORDER — DEXAMETHASONE SODIUM PHOSPHATE 10 MG/ML IJ SOLN
INTRAMUSCULAR | Status: AC
  Filled 2022-04-04: qty 1

## 2022-04-04 MED ORDER — DEXAMETHASONE SODIUM PHOSPHATE 10 MG/ML IJ SOLN
10.0000 mg | Freq: Once | INTRAMUSCULAR | Status: AC
Start: 2022-04-04 — End: 2022-04-04
  Administered 2022-04-04: 10 mg via INTRAMUSCULAR

## 2022-04-04 MED ORDER — DEXAMETHASONE SODIUM PHOSPHATE 10 MG/ML IJ SOLN
20.0000 mg | Freq: Once | INTRAMUSCULAR | Status: DC
Start: 2022-04-04 — End: 2022-04-04
  Filled 2022-04-04: qty 2

## 2022-04-04 NOTE — ED Provider Triage Note (Signed)
Emergency Medicine Provider Triage Evaluation Note  Erika Cain , a 41 y.o. female  was evaluated in triage.  Pt complains of left knee pain for several days.  Patient states she was having some pain in the left knee, going down to her left foot.  Started wearing a knee brace with some relief.  Yesterday she was playing volleyball, and felt her knee buckle and she fell to the ground.  Since then she has been having pain in the entire left leg, from hip to foot.  Has not taken medication for symptoms.  Is having difficulty bearing weight.  Review of Systems  Positive: Leg pain Negative: Numbness  Physical Exam  BP (!) 131/95 (BP Location: Right Arm)   Pulse 94   Temp 98.4 F (36.9 C) (Oral)   Resp 18   Ht 5\' 5"  (1.651 m)   Wt 59 kg   LMP 02/18/2022   SpO2 97%   BMI 21.63 kg/m  Gen:   Awake, no distress   Resp:  Normal effort  MSK:   Moves extremities without difficulty  Other:    Medical Decision Making  Medically screening exam initiated at 3:19 PM.  Appropriate orders placed.  Alanah Sakuma was informed that the remainder of the evaluation will be completed by another provider, this initial triage assessment does not replace that evaluation, and the importance of remaining in the ED until their evaluation is complete.     Cantrell Larouche T, PA-C 04/04/22 1521

## 2022-04-04 NOTE — ED Triage Notes (Signed)
Pt having left knee pain x 2 weeks, fell on Sunday after knee buckled while playing with daughter.

## 2022-04-04 NOTE — ED Provider Notes (Signed)
[facility] EMERGENCY DEPARTMENT Provider Note   CSN: [id number] Arrival date & time: 04/04/22  1330     History  Chief Complaint  Patient presents with   Knee Pain    Erika Cain is a 41 y.o. female complains of left knee pain for several days.  Patient states she was having some pain in the left knee, going down to her left foot.  Started wearing a knee brace with some relief.  Yesterday she was playing volleyball, and felt her knee buckle and she fell to the ground.  Since then she has been having pain in the entire left leg, from hip to foot.  Has not taken medication for symptoms.  Is having difficulty bearing weight.  States that she recently saw neurology with concern for neuropathy in her right leg.  Initial appointment was back in March, and they had concerns for neuropathy versus lupus.  They wanted further blood work, and she has a follow-up appointment in August.   Knee Pain      Home Medications Prior to Admission medications   Medication Sig Start Date End Date Taking? Authorizing Provider  acetaminophen (TYLENOL) 500 MG tablet Take 1,000 mg by mouth every 6 (six) hours as needed.    [provider]  citalopram (CELEXA) 20 MG tablet Take 1 tablet (20 mg total) by mouth daily. 05/23/17   Eustace Moore, MD  cyclobenzaprine (FLEXERIL) 5 MG tablet Take 1 tablet (5 mg total) by mouth 3 (three) times daily as needed for muscle spasms. 05/19/17   Eustace Moore, MD  diazepam (VALIUM) 2 MG tablet Take 2 mg by mouth 2 (two) times daily. 11/08/21   [provider]  escitalopram (LEXAPRO) 20 MG tablet Take 20 mg by mouth daily. 01/05/22   [provider]  gabapentin (NEURONTIN) 100 MG capsule Take 1 capsule (100 mg total) by mouth 3 (three) times daily. 01/24/22   Drema Dallas, DO  HYDROcodone-acetaminophen (NORCO/VICODIN) 5-325 MG tablet Take 1-2 tablets by mouth every 6 (six) hours as needed. 04/04/22   Izzac Rockett T, PA-C  ibuprofen  (ADVIL,MOTRIN) 800 MG tablet Take 1 tablet (800 mg total) by mouth every 8 (eight) hours as needed for moderate pain. 05/19/17   Eustace Moore, MD  olmesartan (BENICAR) 40 MG tablet Take 40 mg by mouth daily. 01/05/22   [provider]  tamsulosin (FLOMAX) 0.4 MG CAPS capsule Take 0.4 mg by mouth daily. 07/27/21   [provider]  traMADol Janean Sark) 50 MG tablet Take by mouth. 07/27/21   [provider]      Allergies    Peanut-containing drug products and Codeine    Review of Systems   Review of Systems  Musculoskeletal:  Positive for arthralgias and myalgias.  All other systems reviewed and are negative.   Physical Exam Updated Vital Signs BP (!) 146/100 (BP Location: Right Arm) Comment: patient reports she isn't taking her BP meds right now  Pulse 86   Temp 98.4 F (36.9 C) (Oral)   Resp 17   Ht 5\' 5"  (1.651 m)   Wt 59 kg   LMP 02/18/2022   SpO2 100%   BMI 21.63 kg/m  Physical Exam Vitals and nursing note reviewed.  Constitutional:      Appearance: Normal appearance.  HENT:     Head: Normocephalic and atraumatic.  Eyes:     Conjunctiva/sclera: Conjunctivae normal.  Cardiovascular:     Pulses:  Posterior tibial pulses are 2+ on the right side and 2+ on the left side.  Pulmonary:     Effort: Pulmonary effort is normal. No respiratory distress.  Musculoskeletal:     Comments: Mild tenderness palpation of the posterior left knee, without significant deformity or bony tenderness.  Generalized tenderness to palpation of the left quadricep.  No bony tenderness of the left hip.  Some pain with left knee extension, 5/5 strength.  4/5 strength of left hip flexion due to pain.  Normal sensation bilaterally.  Skin:    General: Skin is warm and dry.  Neurological:     Mental Status: She is alert.  Psychiatric:        Mood and Affect: Mood normal.        Behavior: Behavior normal.     ED Results / Procedures / Treatments   Labs (all  labs ordered are listed, but only abnormal results are displayed) Labs Reviewed - No data to display  EKG None  Radiology DG Knee 2 Views Left  Result Date: 04/04/2022 CLINICAL DATA:  Left knee pain for 2 weeks after fall EXAM: LEFT KNEE - 1-2 VIEW COMPARISON:  None Available. FINDINGS: No evidence of fracture, dislocation, or joint effusion. No evidence of arthropathy or other focal bone abnormality. Soft tissues are unremarkable. IMPRESSION: Negative. Electronically Signed   By: Duanne Guess D.O.   On: 04/04/2022 14:08    Procedures Procedures    Medications Ordered in ED Medications  dexamethasone (DECADRON) injection 10 mg (10 mg Intramuscular Given 04/04/22 1712)  HYDROcodone-acetaminophen (NORCO/VICODIN) 5-325 MG per tablet 2 tablet (2 tablets Oral Given 04/04/22 1727)    ED Course/ Medical Decision Making/ A&P                           Medical Decision Making Amount and/or Complexity of Data Reviewed Radiology: ordered.  Risk Prescription drug management.  This patient is a 41 y.o. female  who presents to the ED for concern of left knee pain with injury.   Differential diagnoses prior to evaluation: The emergent differential diagnosis includes, but is not limited to, acute fracture dislocation, ligamentous injury, DVT.  This is not an exhaustive differential.   Past Medical History / Co-morbidities: Hypertension, cervical radiculopathy, anxiety  Physical Exam: Physical exam performed. The pertinent findings include: Tenderness to posterior knee and quadriceps without bony tenderness. Slightly decreased strength of hip flexion due to pain, otherwise neurovascularly and neuromuscularly intact in BLE.   Lab Tests/Imaging studies: I personally interpreted labs/imaging and the pertinent results include:  right knee without abnormality. I agree with the radiologist interpretation.   Medications: I ordered medication including Decadron and vicodin  I have reviewed the  patients home medicines and have made adjustments as needed.   Disposition: After consideration of the diagnostic results and the patients response to treatment, I feel that emergency department workup does not suggest an emergent condition requiring admission or immediate intervention beyond what has been performed at this time. The plan is: Will discharge to home with symptomatic treatment of likely quadricep strain versus ligamentous injury of the knee.  Placed patient in a knee immobilizer, and given orthopedic follow-up.  Instructed her to call her neurologist and request an earlier appointment regarding her neuropathy.  The patient is safe for discharge and has been instructed to return immediately for worsening symptoms, change in symptoms or any other concerns.   Final Clinical Impression(s) / ED Diagnoses Final diagnoses:  Acute pain of left knee  Quadriceps strain, left, initial encounter    Rx / DC Orders ED Discharge Orders          Ordered    HYDROcodone-acetaminophen (NORCO/VICODIN) 5-325 MG tablet  Every 6 hours PRN        04/04/22 1702           Portions of this report may have been transcribed using voice recognition software. Every effort was made to ensure accuracy; however, inadvertent computerized transcription errors may be present.    Jeanella Flattery 04/04/22 1748    Eber Hong, MD 04/06/22 1113

## 2022-05-26 ENCOUNTER — Ambulatory Visit (INDEPENDENT_AMBULATORY_CARE_PROVIDER_SITE_OTHER): Payer: Self-pay | Admitting: Neurology

## 2022-05-26 DIAGNOSIS — R292 Abnormal reflex: Secondary | ICD-10-CM

## 2022-05-26 DIAGNOSIS — R202 Paresthesia of skin: Secondary | ICD-10-CM

## 2022-05-26 DIAGNOSIS — R200 Anesthesia of skin: Secondary | ICD-10-CM

## 2022-05-26 NOTE — Procedures (Signed)
[facility]  [address] [place], [address]  [place], [place] [address] Tel: [phone] Fax:  [phone] Test Date:  05/26/2022  Patient: Erika Cain DOB: 04/13/1981 Physician: Nita Sickle, DO  Sex: Female Height: 5\' 5"  Ref Phys: , D.O.  ID#: Shon Millet   Technician:    Patient Complaints: This is a 41 year old female referred for evaluation of bilateral feet paresthesias.  NCV & EMG Findings: Electrodiagnostic testing of the right lower extremity and additional studies of the left shows: Bilateral sural, superficial peroneal, medial and lateral plantar sensory responses are within normal limits. Bilateral peroneal and tibial motor responses are within normal limits. Bilateral tibial H reflex studies are within normal limits. There is no evidence of active or chronic motor axonal changes affecting any of the tested muscles.  Motor unit configuration and recruitment pattern is within normal limits.  Impression: This is a normal study of the lower extremities.  In particular, there is no evidence of a large fiber sensorimotor polyneuropathy or lumbosacral radiculopathy.    ___________________________ 24, DO    Nerve Conduction Studies Anti Sensory Summary Table   Stim Site NR Peak (ms) Norm Peak (ms) P-T Amp (V) Norm P-T Amp  Left Sup Peroneal Anti Sensory (Ant Lat Mall)  32C  12 cm    1.9 <4.5 39.0 >5  Right Sup Peroneal Anti Sensory (Ant Lat Mall)  32C  12 cm    1.9 <4.5 38.4 >5  Left Sural Anti Sensory (Lat Mall)  32C  Calf    2.5 <4.5 38.3 >5  Right Sural Anti Sensory (Lat Mall)  32C  Calf    2.5 <4.5 37.1 >5   Motor Summary Table   Stim Site NR Onset (ms) Norm Onset (ms) O-P Amp (mV) Norm O-P Amp Site1 Site2 Delta-0 (ms) Dist (cm) Vel (m/s) Norm Vel (m/s)  Left Peroneal Motor (Ext Dig Brev)  32C  Ankle    3.2 <5.5 4.8 >3 B Fib Ankle 6.6 37.0 56 >40  B Fib    9.8  4.6  Poplt B Fib 1.5 8.0 53 >40  Poplt    11.3  4.2          Right Peroneal Motor (Ext Dig Brev)  32C  Ankle    2.4 <5.5 7.1 >3 B Fib Ankle 6.7 33.0 49 >40  B Fib    9.1  6.2  Poplt B Fib 1.4 8.0 57 >40  Poplt    10.5  5.7         Left Tibial Motor (Abd Hall Brev)  32C  Ankle    2.9 <6.0 14.6 >8 Knee Ankle 7.1 39.0 55 >40  Knee    10.0  9.4         Right Tibial Motor (Abd Hall Brev)  32C  Ankle    2.7 <6.0 13.3 >8 Knee Ankle 7.2 37.0 51 >40  Knee    9.9  9.3          Mixed Summary Table   Stim Site NR Peak (ms) Norm Peak (ms) P-T Amp (V) Norm P-T Amp  Left Lateral Plantar Mixed (Med Malleolus)  32C  Lateral Foot    2.5 <3.7 12.1 >8  Right Lateral Plantar Mixed (Med Malleolus)  32C  Lateral Foot    2.5 <3.7 12.0 >8  Left Medial Plantar Mixed (Med Malleolus)  32C  Medial Foot    2.6 <3.7 18.8 >8  Right Medial Plantar Mixed (Med Malleolus)  32C  Medial  Foot    2.7 <3.7 21.3 >8   H Reflex Studies   NR H-Lat (ms) Lat Norm (ms) L-R H-Lat (ms)  Left Tibial (Gastroc)  32C     30.07 <35 3.54  Right Tibial (Gastroc)  32C     33.61 <35 3.54   EMG   Side Muscle Ins Act Fibs Psw Fasc Number Recrt Dur Dur. Amp Amp. Poly Poly. Comment  Right AntTibialis Nml Nml Nml Nml Nml Nml Nml Nml Nml Nml Nml Nml N/A  Right Gastroc Nml Nml Nml Nml Nml Nml Nml Nml Nml Nml Nml Nml N/A  Right Flex Dig Long Nml Nml Nml Nml Nml Nml Nml Nml Nml Nml Nml Nml N/A  Right RectFemoris Nml Nml Nml Nml Nml Nml Nml Nml Nml Nml Nml Nml N/A  Right BicepsFemS Nml Nml Nml Nml Nml Nml Nml Nml Nml Nml Nml Nml N/A  Left BicepsFemS Nml Nml Nml Nml Nml Nml Nml Nml Nml Nml Nml Nml N/A  Left AntTibialis Nml Nml Nml Nml Nml Nml Nml Nml Nml Nml Nml Nml N/A  Left Gastroc Nml Nml Nml Nml Nml Nml Nml Nml Nml Nml Nml Nml N/A  Left Flex Dig Long Nml Nml Nml Nml Nml Nml Nml Nml Nml Nml Nml Nml N/A  Left RectFemoris Nml Nml Nml Nml Nml Nml Nml Nml Nml Nml Nml Nml N/A      Waveforms:

## 2023-02-09 ENCOUNTER — Other Ambulatory Visit (HOSPITAL_COMMUNITY): Payer: Self-pay | Admitting: Family Medicine

## 2023-02-09 DIAGNOSIS — Z1231 Encounter for screening mammogram for malignant neoplasm of breast: Secondary | ICD-10-CM

## 2023-02-13 ENCOUNTER — Ambulatory Visit (HOSPITAL_COMMUNITY)
Admission: RE | Admit: 2023-02-13 | Discharge: 2023-02-13 | Disposition: A | Discharge status: Home / Self Care | Payer: Medicaid Other | Source: Ambulatory Visit | Attending: Family Medicine | Admitting: Family Medicine

## 2023-02-13 ENCOUNTER — Encounter (HOSPITAL_COMMUNITY): Payer: Self-pay

## 2023-02-13 DIAGNOSIS — Z1231 Encounter for screening mammogram for malignant neoplasm of breast: Secondary | ICD-10-CM | POA: Diagnosis present

## 2024-01-13 ENCOUNTER — Emergency Department (HOSPITAL_COMMUNITY)

## 2024-01-13 ENCOUNTER — Encounter (HOSPITAL_COMMUNITY): Payer: Self-pay

## 2024-01-13 ENCOUNTER — Other Ambulatory Visit: Payer: Self-pay

## 2024-01-13 ENCOUNTER — Emergency Department (HOSPITAL_COMMUNITY)
Admission: EM | Admit: 2024-01-13 | Discharge: 2024-01-13 | Discharge status: Against Medical Advice | Attending: Emergency Medicine | Admitting: Emergency Medicine

## 2024-01-13 DIAGNOSIS — Z5329 Procedure and treatment not carried out because of patient's decision for other reasons: Secondary | ICD-10-CM | POA: Insufficient documentation

## 2024-01-13 DIAGNOSIS — Z91010 Allergy to peanuts: Secondary | ICD-10-CM | POA: Diagnosis not present

## 2024-01-13 DIAGNOSIS — N2889 Other specified disorders of kidney and ureter: Secondary | ICD-10-CM | POA: Diagnosis not present

## 2024-01-13 DIAGNOSIS — E870 Hyperosmolality and hypernatremia: Secondary | ICD-10-CM

## 2024-01-13 DIAGNOSIS — N200 Calculus of kidney: Secondary | ICD-10-CM | POA: Insufficient documentation

## 2024-01-13 DIAGNOSIS — R109 Unspecified abdominal pain: Secondary | ICD-10-CM

## 2024-01-13 DIAGNOSIS — N289 Disorder of kidney and ureter, unspecified: Secondary | ICD-10-CM

## 2024-01-13 LAB — URINALYSIS, ROUTINE W REFLEX MICROSCOPIC
Bilirubin Urine: NEGATIVE
Glucose, UA: NEGATIVE mg/dL
Hgb urine dipstick: NEGATIVE
Ketones, ur: NEGATIVE mg/dL
Nitrite: NEGATIVE
Protein, ur: NEGATIVE mg/dL
Specific Gravity, Urine: 1.011 (ref 1.005–1.030)
pH: 6 (ref 5.0–8.0)

## 2024-01-13 LAB — BASIC METABOLIC PANEL WITH GFR
Anion gap: 7 (ref 5–15)
Anion gap: 12 (ref 5–15)
BUN: 12 mg/dL (ref 6–20)
BUN: 10 mg/dL (ref 6–20)
CO2: 33 mmol/L — ABNORMAL HIGH (ref 22–32)
CO2: 21 mmol/L — ABNORMAL LOW (ref 22–32)
Calcium: 10.8 mg/dL — ABNORMAL HIGH (ref 8.9–10.3)
Calcium: 9.1 mg/dL (ref 8.9–10.3)
Chloride: 124 mmol/L — ABNORMAL HIGH (ref 98–111)
Chloride: 107 mmol/L (ref 98–111)
Creatinine, Ser: 0.88 mg/dL (ref 0.44–1.00)
Creatinine, Ser: 0.78 mg/dL (ref 0.44–1.00)
GFR, Estimated: >60 mL/min (ref >60)
GFR, Estimated: >60 mL/min (ref >60)
Glucose, Bld: 98 mg/dL (ref 70–99)
Glucose, Bld: 92 mg/dL (ref 70–99)
Potassium: 5.2 mmol/L — ABNORMAL HIGH (ref 3.5–5.1)
Potassium: 3.7 mmol/L (ref 3.5–5.1)
Sodium: 164 mmol/L (ref 135–145)
Sodium: 140 mmol/L (ref 135–145)

## 2024-01-13 LAB — CBC
HCT: 38.3 % (ref 36.0–46.0)
Hemoglobin: 13.2 g/dL (ref 12.0–15.0)
MCH: 33.2 pg (ref 26.0–34.0)
MCHC: 34.5 g/dL (ref 30.0–36.0)
MCV: 96.2 fL (ref 80.0–100.0)
Platelets: 305 10*3/uL (ref 150–400)
RBC: 3.98 MIL/uL (ref 3.87–5.11)
RDW: 12.5 % (ref 11.5–15.5)
WBC: 9.3 10*3/uL (ref 4.0–10.5)
nRBC: 0 % (ref 0.0–0.2)

## 2024-01-13 LAB — PREGNANCY, URINE: Preg Test, Ur: NEGATIVE

## 2024-01-13 MED ORDER — ONDANSETRON HCL 4 MG/2ML IJ SOLN
4.0000 mg | Freq: Once | INTRAMUSCULAR | Status: AC
  Administered 2024-01-13: 4 mg via INTRAVENOUS
  Filled 2024-01-13: qty 2

## 2024-01-13 MED ORDER — MORPHINE SULFATE (PF) 4 MG/ML IV SOLN
4.0000 mg | Freq: Once | INTRAVENOUS | Status: AC
  Administered 2024-01-13: 4 mg via INTRAVENOUS
  Filled 2024-01-13: qty 1

## 2024-01-13 MED ORDER — ACETAMINOPHEN 500 MG PO TABS
1000.0000 mg | ORAL_TABLET | Freq: Once | ORAL | Status: AC
  Administered 2024-01-13: 1000 mg via ORAL
  Filled 2024-01-13: qty 2

## 2024-01-13 MED ORDER — KETOROLAC TROMETHAMINE 15 MG/ML IJ SOLN
15.0000 mg | Freq: Once | INTRAMUSCULAR | Status: AC
  Administered 2024-01-13: 15 mg via INTRAVENOUS
  Filled 2024-01-13: qty 1

## 2024-01-13 NOTE — Discharge Instructions (Addendum)
 It was our pleasure to provide your ER care today - we hope that you feel better.  You CT scan shows small kidney stone in the right kidney, but at the time of the scan there are no stones in the ureter or causing persistent pain.  Incidental note was also made of: 'Indeterminate 13 mm left mid renal lesion, may represent a hemorrhagic cyst but is incompletely characterized on this noncontrast exam. Recommend elective MRI characterization' - discuss above imaging finding with primary care doctor, and have them arrange an outpatient MRI in the next 1-2 months.    Drink plenty of fluids/stay well hydrated. Take acetaminophen or ibuprofen as need.   Follow up with primary care doctor as per above. In addition, your blood pressure is borderline high, have them also recheck your blood pressure.   Return to ER if worse, new symptoms, fevers, severe or intractable pain, persistent vomiting, or other concern.   You were given pain medication in the ER - no driving for the next 6 hours.

## 2024-01-13 NOTE — ED Triage Notes (Signed)
 Pt complaining of flank pain on her right side that started a week ago. Pt stated that she has also been having n/v due to pain and believes she may have a kidney infection. Pt has hx of kidney stones./

## 2024-01-13 NOTE — ED Notes (Signed)
 Pt wanting to leave. States she has to go pick up her kids. Explained that MD was waiting on a repeat lab. Pt did not want to wait. AMA form signed. Dr Denton Lank aware.

## 2024-01-13 NOTE — ED Provider Notes (Signed)
 [place] EMERGENCY DEPARTMENT AT [facility] Provider Note   CSN: [id number] Arrival date & time: 01/13/24  1608     History  Chief Complaint  Patient presents with  . Flank Pain    Erika Cain is a 43 y.o. female.  Patient c/o right flank pain acute onset several days ago, waxing and waning in intensity. Feels similar to prior kidney stones. No hematuria. No fever or chills. Nausea. No vomiting.   The history is provided by the patient and medical records.  Flank Pain Associated symptoms include abdominal pain. Pertinent negatives include no chest pain.       Home Medications Prior to Admission medications   Medication Sig Start Date End Date Taking? Authorizing Provider  acetaminophen (TYLENOL) 500 MG tablet Take 1,000 mg by mouth every 6 (six) hours as needed.    [provider]  citalopram (CELEXA) 20 MG tablet Take 1 tablet (20 mg total) by mouth daily. 05/23/17   Eustace Moore, MD  cyclobenzaprine (FLEXERIL) 5 MG tablet Take 1 tablet (5 mg total) by mouth 3 (three) times daily as needed for muscle spasms. 05/19/17   Eustace Moore, MD  diazepam (VALIUM) 2 MG tablet Take 2 mg by mouth 2 (two) times daily. 11/08/21   [provider]  escitalopram (LEXAPRO) 20 MG tablet Take 20 mg by mouth daily. 01/05/22   [provider]  gabapentin (NEURONTIN) 100 MG capsule Take 1 capsule (100 mg total) by mouth 3 (three) times daily. 01/24/22   Drema Dallas, DO  HYDROcodone-acetaminophen (NORCO/VICODIN) 5-325 MG tablet Take 1-2 tablets by mouth every 6 (six) hours as needed. 04/04/22   Roemhildt, Lorin T, PA-C  ibuprofen (ADVIL,MOTRIN) 800 MG tablet Take 1 tablet (800 mg total) by mouth every 8 (eight) hours as needed for moderate pain. 05/19/17   Eustace Moore, MD  olmesartan (BENICAR) 40 MG tablet Take 40 mg by mouth daily. 01/05/22   [provider]  tamsulosin (FLOMAX) 0.4 MG CAPS capsule Take 0.4 mg by mouth daily. 07/27/21    [provider]  traMADol Janean Sark) 50 MG tablet Take by mouth. 07/27/21   [provider]      Allergies    Peanut-containing drug products and Codeine    Review of Systems   Review of Systems  Constitutional:  Negative for chills and fever.  Respiratory:  Negative for cough.   Cardiovascular:  Negative for chest pain.  Gastrointestinal:  Positive for abdominal pain and nausea. Negative for vomiting.  Genitourinary:  Positive for flank pain. Negative for dysuria and hematuria.  Skin:  Negative for rash.    Physical Exam Updated Vital Signs BP (!) 142/85 (BP Location: Right Arm)   Pulse 83   Temp 98.6 F (37 C) (Oral)   Resp 18   Ht 1.651 m (5\' 5" )   Wt 65.8 kg   SpO2 99%   BMI 24.13 kg/m  Physical Exam Vitals and nursing note reviewed.  Constitutional:      Appearance: Normal appearance. She is well-developed.  HENT:     Head: Atraumatic.     Nose: Nose normal.     Mouth/Throat:     Mouth: Mucous membranes are moist.  Eyes:     General: No scleral icterus.    Conjunctiva/sclera: Conjunctivae normal.  Neck:     Trachea: No tracheal deviation.  Cardiovascular:     Rate and Rhythm: Normal rate and regular rhythm.     Pulses: Normal pulses.  Pulmonary:     Effort: Pulmonary effort is normal. No respiratory distress.  Abdominal:     General: Bowel sounds are normal. There is no distension.     Palpations: Abdomen is soft. There is no mass.     Tenderness: There is no abdominal tenderness. There is no guarding.  Genitourinary:    Comments: No cva tenderness.  Musculoskeletal:        General: No swelling.     Cervical back: Neck supple. No muscular tenderness.     Comments: T/L spine non tender, aligned. No sts or skin changes to area of pain right flank.   Skin:    General: Skin is warm and dry.     Findings: No rash.  Neurological:     Mental Status: She is alert.     Comments: Alert, speech normal. Steady gait.   Psychiatric:        Mood  and Affect: Mood normal.    ED Results / Procedures / Treatments   Labs (all labs ordered are listed, but only abnormal results are displayed) Results for orders placed or performed during the hospital encounter of 01/13/24  Basic metabolic panel   Collection Time: 01/13/24  4:16 PM  Result Value Ref Range   Sodium 164 (HH) 135 - 145 mmol/L   Potassium 5.2 (H) 3.5 - 5.1 mmol/L   Chloride 124 (H) 98 - 111 mmol/L   CO2 33 (H) 22 - 32 mmol/L   Glucose, Bld 98 70 - 99 mg/dL   BUN 12 6 - 20 mg/dL   Creatinine, Ser 4.09 0.44 - 1.00 mg/dL   Calcium 81.1 (H) 8.9 - 10.3 mg/dL   GFR, Estimated >91 >47 mL/min   Anion gap 7 5 - 15  CBC   Collection Time: 01/13/24  4:16 PM  Result Value Ref Range   WBC 9.3 4.0 - 10.5 K/uL   RBC 3.98 3.87 - 5.11 MIL/uL   Hemoglobin 13.2 12.0 - 15.0 g/dL   HCT 82.9 56.2 - 13.0 %   MCV 96.2 80.0 - 100.0 fL   MCH 33.2 26.0 - 34.0 pg   MCHC 34.5 30.0 - 36.0 g/dL   RDW 86.5 78.4 - 69.6 %   Platelets 305 150 - 400 K/uL   nRBC 0.0 0.0 - 0.2 %  Pregnancy, urine   Collection Time: 01/13/24  5:00 PM  Result Value Ref Range   Preg Test, Ur NEGATIVE NEGATIVE  Urinalysis, Routine w reflex microscopic -Urine, Clean Catch   Collection Time: 01/13/24  5:00 PM  Result Value Ref Range   Color, Urine YELLOW YELLOW   APPearance HAZY (A) CLEAR   Specific Gravity, Urine 1.011 1.005 - 1.030   pH 6.0 5.0 - 8.0   Glucose, UA NEGATIVE NEGATIVE mg/dL   Hgb urine dipstick NEGATIVE NEGATIVE   Bilirubin Urine NEGATIVE NEGATIVE   Ketones, ur NEGATIVE NEGATIVE mg/dL   Protein, ur NEGATIVE NEGATIVE mg/dL   Nitrite NEGATIVE NEGATIVE   Leukocytes,Ua LARGE (A) NEGATIVE   RBC / HPF 0-5 0 - 5 RBC/hpf   WBC, UA 0-5 0 - 5 WBC/hpf   Bacteria, UA RARE (A) NONE SEEN   Squamous Epithelial / HPF 6-10 0 - 5 /HPF      EKG None  Radiology CT Renal Stone Study Result Date: 01/13/2024 CLINICAL DATA:  Abdominal/flank pain, stone suspected right flank pain EXAM: CT ABDOMEN AND PELVIS  WITHOUT CONTRAST TECHNIQUE: Multidetector CT imaging of the abdomen and pelvis was performed following the standard  protocol without IV contrast. RADIATION DOSE REDUCTION: This exam was performed according to the departmental dose-optimization program which includes automated exposure control, adjustment of the mA and/or kV according to patient size and/or use of iterative reconstruction technique. COMPARISON:  Remote CT 05/24/2009 FINDINGS: Lower chest: Clear lung bases. Hepatobiliary: Unremarkable unenhanced appearance of the liver. Gallbladder physiologically distended, no calcified stone. No biliary dilatation. Pancreas: Unremarkable unenhanced appearance. Spleen: Unremarkable unenhanced appearance.  Incidental splenule. Adrenals/Urinary Tract: No adrenal nodule. Punctate nonobstructing stone in the upper right kidney. No hydronephrosis. No ureteral calculi. No significant perinephric inflammation. No left renal calculi. Indeterminate 13 mm left mid renal lesion series 2, image 32, Hounsfield units of 43. Unremarkable urinary bladder. Stomach/Bowel: Stomach is within normal limits. Appendix appears normal. No evidence of bowel wall thickening, distention, or inflammatory changes. Moderate colonic stool burden. Vascular/Lymphatic: Mild aorto bi-iliac atherosclerosis. No aneurysm. No bulky abdominopelvic adenopathy. Reproductive: Small amount of free fluid in the pelvis, typically physiologic in a patient of this age. No adnexal mass. Unremarkable appearance of the uterus. Other: Small amount of free fluid in the pelvis, simple density. No abdominal ascites. No free air or focal fluid collection. No abdominal wall hernia. Musculoskeletal: There are no acute or suspicious osseous abnormalities. IMPRESSION: 1. Punctate nonobstructing right renal stone. No ureteral calculi or hydronephrosis. 2. Indeterminate 13 mm left mid renal lesion, may represent a hemorrhagic cyst but is incompletely characterized on this  noncontrast exam. Recommend elective MRI characterization. 3. Small amount of free fluid in the pelvis, typically physiologic in a patient of this age. Electronically Signed   By: Narda Rutherford M.D.   On: 01/13/2024 18:31    Procedures Procedures    Medications Ordered in ED Medications  morphine (PF) 4 MG/ML injection 4 mg (4 mg Intravenous Given 01/13/24 1648)  ondansetron (ZOFRAN) injection 4 mg (4 mg Intravenous Given 01/13/24 1647)  ketorolac (TORADOL) 15 MG/ML injection 15 mg (15 mg Intravenous Given 01/13/24 1648)    ED Course/ Medical Decision Making/ A&P                                 Medical Decision Making Problems Addressed: Hypernatremia: acute illness or injury Kidney stone on right side: acute illness or injury Lesion of left native kidney: undiagnosed new problem with uncertain prognosis    Details: Pcp f/u, outpatient mri recommended Right flank pain: acute illness or injury with systemic symptoms that poses a threat to life or bodily functions  Amount and/or Complexity of Data Reviewed External Data Reviewed: notes. Labs: ordered. Decision-making details documented in ED Course. Radiology: ordered and independent interpretation performed. Decision-making details documented in ED Course.  Risk OTC drugs. Prescription drug management. Parenteral controlled substances. Decision regarding hospitalization.   Iv ns. Labs ordered/sent. Imaging ordered.   Differential diagnosis includes ureteral stone, pyelonephritis, etc. Dispo decision including potential need for admission considered - will get labs and imaging and reassess.   Reviewed nursing notes and prior charts for additional history. External reports reviewed.  Labs reviewed/interpreted by me - ua w 0-5  rbc/wbc, not c/w uti.  Wbc and hgb normal. Initial bmet w high na - will re-draw/repeat to verify.   Toradol iv. Morphine iv. Zofran iv.   CT reviewed/interpreted by me - small right kidney stone,  no  ureteral stone eor hydro. 13 mm left renal lesion/?cyst vs other - shared and rec pcp, f/u imaging.   Recheck pain improved/resolved.  Repeat bmet delayed. Lab indicates machine down and couriered to [facility]. Pt updated on delay and importance of staying as initial na read as very high, and need to treat and admit if that result is verified. Pt voices understanding.   Lab called again re persistent delay - they indicate multiple machine delays but has been sent to [facility] and remains pending.   Went to recheck pt and update on continued further delays and/or offer istat - pt had already left ED AMA prior to completion eval.            Final Clinical Impression(s) / ED Diagnoses Final diagnoses:  Right flank pain  Kidney stone on right side  Lesion of left native kidney    Rx / DC Orders ED Discharge Orders     None         Cathren Laine, MD 01/13/24 2143

## 2024-01-14 ENCOUNTER — Telehealth: Admitting: Family

## 2024-01-14 DIAGNOSIS — N200 Calculus of kidney: Secondary | ICD-10-CM

## 2024-01-14 DIAGNOSIS — R109 Unspecified abdominal pain: Secondary | ICD-10-CM

## 2024-01-14 NOTE — Progress Notes (Signed)
  Because of your pain, I feel your condition warrants further evaluation and I recommend that you be seen in a face-to-face visit.  I did review your urine and it was negative for infection.    NOTE: There will be NO CHARGE for this E-Visit   If you are having a true medical emergency, please call 911.     For an urgent face to face visit, [place] has multiple urgent care centers for your convenience.  Click the link below for the full list of locations and hours, walk-in wait times, appointment scheduling options and driving directions:  Urgent Care - [place], [place], [place], [place], [place], [place]  [place]     Your MyChart E-visit questionnaire answers were reviewed by a board certified advanced clinical practitioner to complete your personal care plan based on your specific symptoms.    Thank you for using e-Visits.
